# Patient Record
Sex: Male | Born: 1980 | Race: White | Hispanic: No | Marital: Single | Smoking: Current every day smoker
Health system: Southern US, Community
[De-identification: ages and names within clinical notes are randomized; demographics above are authoritative.]

## PROBLEM LIST (undated history)

## (undated) DIAGNOSIS — F329 Major depressive disorder, single episode, unspecified: Secondary | ICD-10-CM

## (undated) DIAGNOSIS — R569 Unspecified convulsions: Secondary | ICD-10-CM

## (undated) DIAGNOSIS — F419 Anxiety disorder, unspecified: Secondary | ICD-10-CM

## (undated) DIAGNOSIS — F32A Depression, unspecified: Secondary | ICD-10-CM

## (undated) HISTORY — PX: WISDOM TOOTH EXTRACTION: SHX21

---

## 2003-07-17 ENCOUNTER — Emergency Department (HOSPITAL_COMMUNITY): Admission: EM | Admit: 2003-07-17 | Discharge: 2003-07-17 | Payer: Self-pay | Admitting: *Deleted

## 2004-03-18 ENCOUNTER — Emergency Department (HOSPITAL_COMMUNITY): Admission: EM | Admit: 2004-03-18 | Discharge: 2004-03-19 | Payer: Self-pay | Admitting: *Deleted

## 2004-04-15 ENCOUNTER — Ambulatory Visit (HOSPITAL_COMMUNITY): Admission: RE | Admit: 2004-04-15 | Discharge: 2004-04-15 | Payer: Self-pay | Admitting: Family Medicine

## 2004-10-28 ENCOUNTER — Emergency Department (HOSPITAL_COMMUNITY): Admission: EM | Admit: 2004-10-28 | Discharge: 2004-10-28 | Payer: Self-pay | Admitting: Emergency Medicine

## 2005-02-18 ENCOUNTER — Emergency Department (HOSPITAL_COMMUNITY): Admission: EM | Admit: 2005-02-18 | Discharge: 2005-02-19 | Payer: Self-pay | Admitting: Emergency Medicine

## 2005-04-22 ENCOUNTER — Observation Stay (HOSPITAL_COMMUNITY): Admission: EM | Admit: 2005-04-22 | Discharge: 2005-04-23 | Payer: Self-pay | Admitting: Emergency Medicine

## 2006-02-26 ENCOUNTER — Emergency Department (HOSPITAL_COMMUNITY): Admission: EM | Admit: 2006-02-26 | Discharge: 2006-02-26 | Payer: Self-pay | Admitting: Emergency Medicine

## 2007-05-04 ENCOUNTER — Ambulatory Visit (HOSPITAL_COMMUNITY): Admission: RE | Admit: 2007-05-04 | Discharge: 2007-05-04 | Payer: Self-pay | Admitting: Family Medicine

## 2011-04-25 NOTE — Procedures (Signed)
REQUESTING PHYSICIAN:  Scott A. Gerda Diss, M.D.   The patient described as awake and drowsy. This is a routine EEG done with  photic and hyperventilation.   CLINICAL HISTORY:  A 30 year old man with history of dizzy spells and  falling. EEG is for evaluation.   DESCRIPTION:  The dominant rhythm of this tracing is a moderate amplitude  alpha rhythm, 10 to 11 hertz, which dominates posteriorly, appears not in  normal asymmetry. It attenuates with eye opening and closing.  __________  activity is seen frontally and centrally and appears not in normal  asymmetry. No focal slowing and no epileptiform discharges are seen. Much of  the recording is made in the drowsy state as evidenced by fragmentation of  the background and generalized attenuation of rhythms into a low amplitude  theta state. Intermittent spells of high amplitude theta rhythms with 5 to 6  hertz are seen and dominating frontally and centrally, and these are felt to  represent a post arousal pattern and are not definitely epileptiform. Stage  II sleep is not seen. Photic stimulation produced symmetric driving  responses. Hyperventilation produced no significant change in the background  rhythm. Single channel devoted to EKG revealed sinus rhythm throughout a  rate of approximately 78 beats per minute.   CONCLUSION:  Normal study in the awake and drowsy states.    Fenton Malling, M.D.   ZOX:WRUE  D:  04/15/2004 21:02:13  T:  04/16/2004 07:42:16  Job #:  [id number]

## 2011-04-25 NOTE — Consult Note (Signed)
NAME:  Shane Butler, Shane Butler                ACCOUNT NO.:  [id number]   MEDICAL RECORD NO.:  [id number]          PATIENT TYPE:  OBV   LOCATION:  A220                          FACILITY:  [facility]   PHYSICIAN:  Scott A. Gerda Diss, MD    DATE OF BIRTH:  23-Jun-1981   DATE OF CONSULTATION:  DATE OF DISCHARGE:  04/23/2005                                   CONSULTATION   Patient discharged from observation.  No further seizures.  It is thought  this seizure is probably alcohol-related.  He needs outpatient EEG.  His MRI  was completely normal.  He is to follow up with Dr. Gerilyn Pilgrim for EEG as well  as an appointment.  He will also follow up with [place] in approximately 3-4  weeks.   In addition, he was sent home on Ativan 0.5 mg 1 t.i.d. x2 days, then 1  b.i.d. x2 days, then 1 daily p.r.n.  He is to avoid alcohol.  He is also to  notify us if any problems.  He is not to drive until cleared to do so;  however, the patient currently does not have a license anyway.      SAL/MEDQ  D:  04/23/2005  T:  04/23/2005  Job:  [id number]

## 2011-04-25 NOTE — H&P (Signed)
NAME:  Shane Butler, Shane Butler                ACCOUNT NO.:  [id number]   MEDICAL RECORD NO.:  [id number]          PATIENT TYPE:  OBV   LOCATION:  A220                          FACILITY:  [facility]   PHYSICIAN:  Scott A. Gerda Diss, MD    DATE OF BIRTH:  June 18, 1981   DATE OF ADMISSION:  04/22/2005  DATE OF DISCHARGE:  LH                                HISTORY & PHYSICAL   CHIEF COMPLAINT:  Seizure.   HISTORY OF PRESENT ILLNESS:  This is a 30 year old male who had a seizure  earlier today, and his mom states that she heard a loud noise in his  bedroom, went to check it out, and found him having a seizure on the floor.  It lasted approximately 10 minutes.  She called EMS.  EMS came and then  brought him to the hospital.  His postictal state lasted approximately 20  minutes.  There was no vomiting, fever, diarrhea.  No recent head injuries,  cough, congestion, or sickness.  The patient had one episode approximately  13 months ago when he was at work, felt dizzy, and stated he passed out, hit  his head, and had a seizure.  He had not had one since then.  At that time,  he had an EEG, which was normal, and a CT scan which was normal.   PAST MEDICAL HISTORY:  1.  The patient has anxiety problems.  He has been on Zoloft but does not      take Xanax on a regular basis.  States his overall energy level has been      pretty decent today.  It has also been noted socially that the patient      does drink.  He notes five beers per night and states he did not drink      any beer last night.  Denies abuse of any drugs.  He had a drug screen      done in the ER which did show benzodiazepines.  He does use those on      rare occasions according to the patient.   REVIEW OF SYSTEMS:  See present illness.   PHYSICAL EXAMINATION:  GENERAL:  Benign.  HEENT:  Pupils respond to light.  TMs __________.  NECK:  Supple.  CHEST: Clear.  HEART:  Regular, no murmurs.  ABDOMEN:  Soft.  EXTREMITIES:  No edema.  SKIN:  Warm and  dry.  NEUROLOGIC:  Testing normal.   LABORATORY DATA:  Overall labs look good.  Hemoglobin slightly elevated at  17.2.  BUN and creatinine look normal.  Urinalysis negative.   ASSESSMENT AND PLAN:  Seizure, negative CT scan.  This is concerning given  that this is a second episode.  I think it could well have been a withdrawal  seizure given that he did not drink. We will go ahead and give him low dose  of Xanax every 6 hours while here in the hospital.  Could also be onset of  an adult seizure disorder.  I think it is  best to go ahead and have Dr. Gerilyn Pilgrim see the  patient for evaluation as  well in that he may need to be on antiseizure medication.  Certainly I think  he will probably need to have an EEG done again.  Hopefully the patient will  be able to go home tomorrow.      SAL/MEDQ  D:  04/22/2005  T:  04/22/2005  Job:  [id number]

## 2011-04-25 NOTE — Consult Note (Signed)
NAME:  Shane Butler, Shane Butler                ACCOUNT NO.:  [id number]   MEDICAL RECORD NO.:  [id number]          PATIENT TYPE:  OBV   LOCATION:  A220                          FACILITY:  [facility]   PHYSICIAN:  Kofi A. Gerilyn Pilgrim, M.D. DATE OF BIRTH:  05-Dec-1981   DATE OF CONSULTATION:  04/22/2005  DATE OF DISCHARGE:                                   CONSULTATION   DIAGNOSES:  The patient presents with alcohol withdrawal seizures.  There  does not seem to be any significant risk factors baseline other than perhaps  a mild head injury.   PLAN:  1.  Repeat EEG.  2.  MRI of the brain.  3.  Seizure precautions, particularly avoiding driving and operating      machinery.  The patient apparently does not drive because of a DUI      conviction two years ago.  Seizure precautions were also discussed with      the patient such as not avoiding heights for now.   HISTORY:  This 30 year old Caucasian man, who has history of anxiety,  currently at baseline.  The patient apparently had an event a year ago where  he apparently got dizzy and passed out.  He was witnessed to have what  appeared to be seizure activity briefly after he passed out.  He had a  workup at that time including EEG which was unrevealing.  The patient takes  Zoloft at baseline and has been on alprazolam, although he has not been  prescribed this recently.  He apparently takes alprazolam every now and  then, about once a week or so, but not on a consistent basis.  The patient  indicates that he does drink and has had a problem with drinking in the  past.  He indicated that his drinking eventually got better but continues to  drink on average about four beers nightly.  He did not drink anything last  night.  The patient does not recall a family history of seizures.  He has  had a mild head injury associated with concussion and brief loss of  consciousness in the past.  There is no history of CNS infection such as  meningitis.  He reports  being born full term without any complications and  has done well at school without any developmental delays.   PAST MEDICAL HISTORY:  As above.   MEDICATIONS:  Zoloft.   SOCIAL HISTORY:  Archivist.  No reports of illicit drug use.  Does  drink about five beers nightly.   FAMILY HISTORY:  Not applicable.   REVIEW OF SYSTEMS:  As in history of present illness, especially he does  endorse having significant muscle soreness after his seizures.  He does  complain of some mild posterior headache.   PHYSICAL EXAMINATION:  GENERAL:  Pleasant, well-nourished gentleman in no  acute distress.  VITAL SIGNS:  Temperature 97.7, pulse 85, blood pressure 120/69,  respirations 28.  NECK:  Supple  NEUROLOGIC:  The patient is awake, alert.  He converses well.  Speech,  language, and cognition are intact.  Cranial nerve evaluation shows  large  pupils at 6 mm that are briskly reactive.  Funduscopic evaluation shows  flat disks although no spontaneous venous pulsations are noted.  Visual  fields are intact.  Extraocular movements are intact.  Facial muscles are  symmetric.  Tongue is midline.  Uvula is midline.  Shoulder shrugs are  normal.  Motor examination shows normal tone, bulk, and strength.  There is  no pronator drift.  Coordination is intact.  Reflexes are normal.  Plantar  reflexes are both flexor.  Sensory examination intact to light touch and  temperature.   LABORATORY DATA AND OTHER STUDIES:  Head CT scan films reviewed and shows no  acute process.   WBC 7, hemoglobin 17, platelet count 225.  Sodium 139, potassium 3.8,  chloride 102, CO2 26, glucose 118, BUN 13, creatinine 1.1.  Liver enzymes  are normal.  Calcium 9.6.  Urinalysis negative.  Urine drug screen positive  for benzodiazepines.   Thanks for this consultation.       KAD/MEDQ  D:  04/22/2005  T:  04/22/2005  Job:  [id number]

## 2014-04-17 ENCOUNTER — Encounter: Payer: Self-pay | Admitting: Family Medicine

## 2014-04-17 ENCOUNTER — Ambulatory Visit (INDEPENDENT_AMBULATORY_CARE_PROVIDER_SITE_OTHER): Payer: BC Managed Care – PPO | Admitting: Family Medicine

## 2014-04-17 VITALS — BP 128/80 | Temp 98.0°F | Ht 71.0 in | Wt 121.4 lb

## 2014-04-17 DIAGNOSIS — J069 Acute upper respiratory infection, unspecified: Secondary | ICD-10-CM

## 2014-04-17 DIAGNOSIS — J019 Acute sinusitis, unspecified: Secondary | ICD-10-CM

## 2014-04-17 MED ORDER — HYDROCODONE-HOMATROPINE 5-1.5 MG/5ML PO SYRP: 5.0000 mL | ORAL_SOLUTION | Freq: Three times a day (TID) | ORAL | Status: DC | PRN

## 2014-04-17 MED ORDER — AZITHROMYCIN 250 MG PO TABS: ORAL_TABLET | ORAL | Status: DC

## 2014-04-17 NOTE — Progress Notes (Signed)
   Subjective:    Patient ID: Shane Butler, male    DOB: 27-Jul-1981, 33 y.o.   MRN: [id number]  URI  This is a new problem. The current episode started in the past 7 days. The problem has been unchanged. There has been no fever. Associated symptoms include congestion, coughing and rhinorrhea. Pertinent negatives include no chest pain, ear pain or wheezing. Associated symptoms comments: Hoarse voice . He has tried decongestant for the symptoms. The treatment provided no relief.   Patient states he has no other concerns at this time.    Review of Systems  Constitutional: Negative for fever, chills and activity change.       Hoarse  HENT: Positive for congestion and rhinorrhea. Negative for ear pain.   Eyes: Negative for discharge.  Respiratory: Positive for cough. Negative for wheezing.   Cardiovascular: Negative for chest pain.       Objective:   Physical Exam  Nursing note and vitals reviewed. Constitutional: He appears well-developed.  HENT:  Head: Normocephalic.  Mouth/Throat: Oropharynx is clear and moist. No oropharyngeal exudate.  Neck: Normal range of motion.  Cardiovascular: Normal rate, regular rhythm and normal heart sounds.   No murmur heard. Pulmonary/Chest: Effort normal and breath sounds normal. He has no wheezes.  Lymphadenopathy:    He has no cervical adenopathy.  Neurological: He exhibits normal muscle tone.  Skin: Skin is warm and dry.          Assessment & Plan:  1. Acute upper respiratory infections of unspecified site Antibiotics prescribed for the sinuses Z-Pak as directed followup if ongoing troubles  2. Acute sinus infection More than likely start off as a virus triggered sinus infection antibiotics prescribed cough medication given not for long-term use cautioned drowsiness warning signs discussed

## 2015-04-11 ENCOUNTER — Encounter: Payer: Self-pay | Admitting: Family Medicine

## 2015-04-11 ENCOUNTER — Ambulatory Visit (INDEPENDENT_AMBULATORY_CARE_PROVIDER_SITE_OTHER): Payer: 59 | Admitting: Family Medicine

## 2015-04-11 VITALS — BP 114/76 | Temp 98.3°F | Ht 69.75 in | Wt 125.0 lb

## 2015-04-11 DIAGNOSIS — J329 Chronic sinusitis, unspecified: Secondary | ICD-10-CM | POA: Diagnosis not present

## 2015-04-11 DIAGNOSIS — J310 Chronic rhinitis: Secondary | ICD-10-CM

## 2015-04-11 MED ORDER — AZITHROMYCIN 250 MG PO TABS: ORAL_TABLET | ORAL | Status: DC

## 2015-04-11 MED ORDER — HYDROCODONE-HOMATROPINE 5-1.5 MG/5ML PO SYRP: ORAL_SOLUTION | ORAL | Status: DC

## 2015-04-11 NOTE — Progress Notes (Signed)
   Subjective:    Patient ID: Shane Butler, male    DOB: [birth year], 34 y.o.   MRN: [id number]  Cough This is a recurrent problem. Associated symptoms include headaches, nasal congestion, a sore throat and wheezing. Treatments tried: nyquil, dayquil, cough syrups, benadryl, zyrtec.    Cough up cong and drainage  Has used alb in the past  Allergy stuff normal runny nose etc. Last wk  Headache, primarily frontal in nature.  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   Respiratory: Positive for cough and wheezing.   Neurological: Positive for headaches.       Objective:   Physical Exam Alert mild malaise. H&T moderate his congestion frontal tenderness pharynx slight erythema neck supple. Lungs clear. Heart rare rhythm.       Assessment & Plan:  Impression acute rhinosinusitis #2 allergic rhinitis plan antibiotics prescribed. Symptom care discussed. Allergy measures discussed WSL

## 2015-08-15 ENCOUNTER — Encounter: Payer: Self-pay | Admitting: Family Medicine

## 2015-08-15 ENCOUNTER — Ambulatory Visit (INDEPENDENT_AMBULATORY_CARE_PROVIDER_SITE_OTHER): Payer: 59 | Admitting: Family Medicine

## 2015-08-15 VITALS — BP 120/76 | Temp 97.9°F | Ht 69.75 in | Wt 119.6 lb

## 2015-08-15 DIAGNOSIS — J019 Acute sinusitis, unspecified: Secondary | ICD-10-CM

## 2015-08-15 DIAGNOSIS — B9689 Other specified bacterial agents as the cause of diseases classified elsewhere: Secondary | ICD-10-CM

## 2015-08-15 MED ORDER — AMOXICILLIN-POT CLAVULANATE 875-125 MG PO TABS: 1.0000 | ORAL_TABLET | Freq: Two times a day (BID) | ORAL | Status: DC

## 2015-08-15 NOTE — Patient Instructions (Signed)
Call us if not getting better with Augmentin   May use Zrytec and Flonase as needed

## 2015-08-15 NOTE — Progress Notes (Signed)
   Subjective:    Patient ID: Shane Butler, male    DOB: [birth year], 34 y.o.   MRN: [id number]  Sinus Problem This is a new problem. The current episode started 1 to 4 weeks ago. Associated symptoms include congestion, coughing, ear pain, headaches and a sore throat. (Fever) Treatments tried: otc meds.   Patient relates about a week and half of head congestion sinus pressure pain discomfort he has a history of a deviated septum has difficult time breathing through his nose because of it has significant sinus pressure pain and discomfort along with mucoid drainage over the past week no high fever chills   Review of Systems  Constitutional: Negative for fever and activity change.  HENT: Positive for congestion, ear pain, rhinorrhea and sore throat.   Eyes: Negative for discharge.  Respiratory: Positive for cough. Negative for wheezing.   Cardiovascular: Negative for chest pain.  Neurological: Positive for headaches.       Objective:   Physical Exam  Constitutional: He appears well-developed.  HENT:  Head: Normocephalic.  Mouth/Throat: Oropharynx is clear and moist. No oropharyngeal exudate.  Neck: Normal range of motion.  Cardiovascular: Normal rate, regular rhythm and normal heart sounds.   No murmur heard. Pulmonary/Chest: Effort normal and breath sounds normal. He has no wheezes.  Lymphadenopathy:    He has no cervical adenopathy.  Neurological: He exhibits normal muscle tone.  Skin: Skin is warm and dry.  Nursing note and vitals reviewed.         Assessment & Plan:  Sinusitis-antibiotics prescribed warning signs discussed follow-up of problems  Mild allergies Claritin and Flonase recommended  Excessive stress associated with his job we discussed how he was doing he denies being depressed

## 2016-05-06 ENCOUNTER — Ambulatory Visit (INDEPENDENT_AMBULATORY_CARE_PROVIDER_SITE_OTHER): Payer: No Typology Code available for payment source | Admitting: Family Medicine

## 2016-05-06 ENCOUNTER — Encounter: Payer: Self-pay | Admitting: Family Medicine

## 2016-05-06 VITALS — BP 106/70 | Temp 98.6°F | Ht 69.75 in | Wt 123.2 lb

## 2016-05-06 DIAGNOSIS — R590 Localized enlarged lymph nodes: Secondary | ICD-10-CM | POA: Diagnosis not present

## 2016-05-06 DIAGNOSIS — R220 Localized swelling, mass and lump, head: Secondary | ICD-10-CM

## 2016-05-06 DIAGNOSIS — R221 Localized swelling, mass and lump, neck: Secondary | ICD-10-CM

## 2016-05-06 MED ORDER — DOXYCYCLINE HYCLATE 100 MG PO CAPS: 100.0000 mg | ORAL_CAPSULE | Freq: Two times a day (BID) | ORAL | Status: DC

## 2016-05-06 NOTE — Progress Notes (Signed)
   Subjective:    Patient ID: Shane Butler, male    DOB: March 18, 1981, 35 y.o.   MRN: [id number]  HPI Patient is here today for a lump in his throat. Onset 2 days ago. Treatments tried: none.  Patient does have a history of smoking does have a family history of cancer He states he notices couple days ago denies any fever chills or severe pain with it. No vomiting or diarrhea sweats weight loss or appetite change.  Review of Systems     Objective:   Physical Exam  HEENT eardrums normal throat is normal no oral masses are felt but there is a submandibular growth on the left anterior aspect could be a lymph node versus a growth lungs are clear hearts regular      Assessment & Plan:  Growth in the left anterior neck region is noted. There does not appear to be any abscess with this. It is not tender. Patient not having any constitutional symptoms of lymphoma. I do recommend lab work. We will do one round of antibiotics. Referral to ENT. May need CAT scan as well.

## 2016-05-07 ENCOUNTER — Telehealth: Payer: Self-pay | Admitting: *Deleted

## 2016-05-07 ENCOUNTER — Other Ambulatory Visit: Payer: Self-pay | Admitting: *Deleted

## 2016-05-07 DIAGNOSIS — R590 Localized enlarged lymph nodes: Secondary | ICD-10-CM

## 2016-05-07 NOTE — Progress Notes (Signed)
Dr Nicki Reaper spoke with dr Benjamine Mola. See telephone note

## 2016-05-07 NOTE — Telephone Encounter (Signed)
Dr. Nicki Reaper spoke with DR. Teoh. Pt needs ct soft tissue neck with contrast before appt with dr Benjamine Mola. CT scheduled [facility] June 8th at 1 pm. Register 12:45. Pt notified of appt.

## 2016-05-07 NOTE — Telephone Encounter (Signed)
I did discuss the case with Dr.Teoh, he does recommend having the CAT scan done then having the patient being seen by him please set up office visit with ENT to be after the CAT scan possibly the following week

## 2016-05-08 LAB — CBC WITH DIFFERENTIAL/PLATELET
BASOS ABS: 0.1 10*3/uL (ref 0.0–0.2)
Basos: 1 %
EOS (ABSOLUTE): 0.4 10*3/uL (ref 0.0–0.4)
Eos: 6 %
HEMOGLOBIN: 15.9 g/dL (ref 12.6–17.7)
Hematocrit: 47.3 % (ref 37.5–51.0)
IMMATURE GRANS (ABS): 0 10*3/uL (ref 0.0–0.1)
Immature Granulocytes: 0 %
LYMPHS ABS: 2.1 10*3/uL (ref 0.7–3.1)
Lymphs: 33 %
MCH: 31 pg (ref 26.6–33.0)
MCHC: 33.6 g/dL (ref 31.5–35.7)
MCV: 92 fL (ref 79–97)
MONOCYTES: 7 %
Monocytes Absolute: 0.5 10*3/uL (ref 0.1–0.9)
Neutrophils Absolute: 3.4 10*3/uL (ref 1.4–7.0)
Neutrophils: 53 %
Platelets: 230 10*3/uL (ref 150–379)
RBC: 5.13 x10E6/uL (ref 4.14–5.80)
RDW: 13.8 % (ref 12.3–15.4)
WBC: 6.5 10*3/uL (ref 3.4–10.8)

## 2016-05-08 LAB — HEPATIC FUNCTION PANEL
ALBUMIN: 4.3 g/dL (ref 3.5–5.5)
ALK PHOS: 106 IU/L (ref 39–117)
ALT: 21 IU/L (ref 0–44)
AST: 19 IU/L (ref 0–40)
Bilirubin Total: 0.7 mg/dL (ref 0.0–1.2)
Bilirubin, Direct: 0.16 mg/dL (ref 0.00–0.40)
TOTAL PROTEIN: 7 g/dL (ref 6.0–8.5)

## 2016-05-08 LAB — BASIC METABOLIC PANEL
BUN/Creatinine Ratio: 14 (ref 9–20)
BUN: 12 mg/dL (ref 6–20)
CO2: 24 mmol/L (ref 18–29)
CREATININE: 0.87 mg/dL (ref 0.76–1.27)
Calcium: 9.4 mg/dL (ref 8.7–10.2)
Chloride: 99 mmol/L (ref 96–106)
GFR calc Af Amer: 131 mL/min/{1.73_m2} (ref >59)
GFR calc non Af Amer: 113 mL/min/{1.73_m2} (ref >59)
GLUCOSE: 68 mg/dL (ref 65–99)
Potassium: 4.7 mmol/L (ref 3.5–5.2)
SODIUM: 144 mmol/L (ref 134–144)

## 2016-05-08 LAB — TSH: TSH: 0.602 u[IU]/mL (ref 0.450–4.500)

## 2016-05-09 NOTE — Addendum Note (Signed)
Addended by: Launa Grill on: 05/09/2016 03:59 PM   Modules accepted: Orders

## 2016-05-15 ENCOUNTER — Ambulatory Visit (HOSPITAL_COMMUNITY)
Admission: RE | Admit: 2016-05-15 | Discharge: 2016-05-15 | Disposition: A | Discharge status: Home / Self Care | Payer: No Typology Code available for payment source | Source: Ambulatory Visit | Attending: Family Medicine | Admitting: Family Medicine

## 2016-05-15 DIAGNOSIS — R590 Localized enlarged lymph nodes: Secondary | ICD-10-CM | POA: Insufficient documentation

## 2016-05-15 MED ORDER — IOPAMIDOL (ISOVUE-300) INJECTION 61%
75.0000 mL | Freq: Once | INTRAVENOUS | Status: AC | PRN
  Administered 2016-05-15: 75 mL via INTRAVENOUS

## 2016-05-15 NOTE — Progress Notes (Signed)
Appt schedule with Dr. Benjamine Mola for Monday 05/19/2016 at 1:50 pm.  I called and notified the patient, he states that he has to work Monday.  I gave them the phone number to reschedule and advised him if he had trouble rescheduling to please give [place] a call.

## 2016-05-19 ENCOUNTER — Ambulatory Visit (INDEPENDENT_AMBULATORY_CARE_PROVIDER_SITE_OTHER): Payer: No Typology Code available for payment source | Admitting: Otolaryngology

## 2016-05-19 DIAGNOSIS — D487 Neoplasm of uncertain behavior of other specified sites: Secondary | ICD-10-CM

## 2016-05-19 DIAGNOSIS — R220 Localized swelling, mass and lump, head: Secondary | ICD-10-CM

## 2016-06-12 ENCOUNTER — Ambulatory Visit (INDEPENDENT_AMBULATORY_CARE_PROVIDER_SITE_OTHER): Payer: No Typology Code available for payment source | Admitting: Otolaryngology

## 2016-06-12 DIAGNOSIS — D487 Neoplasm of uncertain behavior of other specified sites: Secondary | ICD-10-CM

## 2016-08-14 ENCOUNTER — Ambulatory Visit (INDEPENDENT_AMBULATORY_CARE_PROVIDER_SITE_OTHER): Payer: No Typology Code available for payment source | Admitting: Otolaryngology

## 2016-08-14 DIAGNOSIS — D487 Neoplasm of uncertain behavior of other specified sites: Secondary | ICD-10-CM | POA: Diagnosis not present

## 2017-02-16 ENCOUNTER — Encounter: Payer: Self-pay | Admitting: Nurse Practitioner

## 2017-02-16 ENCOUNTER — Ambulatory Visit (INDEPENDENT_AMBULATORY_CARE_PROVIDER_SITE_OTHER): Payer: No Typology Code available for payment source | Admitting: Nurse Practitioner

## 2017-02-16 VITALS — BP 114/80 | Temp 98.1°F | Ht 69.75 in | Wt 126.2 lb

## 2017-02-16 DIAGNOSIS — J069 Acute upper respiratory infection, unspecified: Secondary | ICD-10-CM

## 2017-02-16 DIAGNOSIS — K219 Gastro-esophageal reflux disease without esophagitis: Secondary | ICD-10-CM | POA: Diagnosis not present

## 2017-02-16 DIAGNOSIS — J209 Acute bronchitis, unspecified: Secondary | ICD-10-CM | POA: Diagnosis not present

## 2017-02-16 MED ORDER — OMEPRAZOLE 20 MG PO CPDR: 20.0000 mg | DELAYED_RELEASE_CAPSULE | Freq: Every day | ORAL | 2 refills | Status: DC

## 2017-02-16 MED ORDER — AZITHROMYCIN 250 MG PO TABS: ORAL_TABLET | ORAL | 0 refills | Status: DC

## 2017-02-16 MED ORDER — HYDROCODONE-HOMATROPINE 5-1.5 MG/5ML PO SYRP: 5.0000 mL | ORAL_SOLUTION | ORAL | 0 refills | Status: DC | PRN

## 2017-02-16 NOTE — Patient Instructions (Signed)
Food Choices for Gastroesophageal Reflux Disease, Adult When you have gastroesophageal reflux disease (GERD), the foods you eat and your eating habits are very important. Choosing the right foods can help ease the discomfort of GERD. Consider working with a diet and nutrition specialist (dietitian) to help you make healthy food choices. What general guidelines should I follow? Eating plan  Choose healthy foods low in fat, such as fruits, vegetables, whole grains, low-fat dairy products, and lean meat, fish, and poultry.  Eat frequent, small meals instead of three large meals each day. Eat your meals slowly, in a relaxed setting. Avoid bending over or lying down until 2-3 hours after eating.  Limit high-fat foods such as fatty meats or fried foods.  Limit your intake of oils, butter, and shortening to less than 8 teaspoons each day.  Avoid the following: ? Foods that cause symptoms. These may be different for different people. Keep a food diary to keep track of foods that cause symptoms. ? Alcohol. ? Drinking large amounts of liquid with meals. ? Eating meals during the 2-3 hours before bed.  Cook foods using methods other than frying. This may include baking, grilling, or broiling. Lifestyle   Maintain a healthy weight. Ask your health care provider what weight is healthy for you. If you need to lose weight, work with your health care provider to do so safely.  Exercise for at least 30 minutes on 5 or more days each week, or as told by your health care provider.  Avoid wearing clothes that fit tightly around your waist and chest.  Do not use any products that contain nicotine or tobacco, such as cigarettes and e-cigarettes. If you need help quitting, ask your health care provider.  Sleep with the head of your bed raised. Use a wedge under the mattress or blocks under the bed frame to raise the head of the bed. What foods are not recommended? The items listed may not be a complete  list. Talk with your dietitian about what dietary choices are best for you. Grains Pastries or quick breads with added fat. French toast. Vegetables Deep fried vegetables. French fries. Any vegetables prepared with added fat. Any vegetables that cause symptoms. For some people this may include tomatoes and tomato products, chili peppers, onions and garlic, and horseradish. Fruits Any fruits prepared with added fat. Any fruits that cause symptoms. For some people this may include citrus fruits, such as oranges, grapefruit, pineapple, and lemons. Meats and other protein foods High-fat meats, such as fatty beef or pork, hot dogs, ribs, ham, sausage, salami and bacon. Fried meat or protein, including fried fish and fried chicken. Nuts and nut butters. Dairy Whole milk and chocolate milk. Sour cream. Cream. Ice cream. Cream cheese. Milk shakes. Beverages Coffee and tea, with or without caffeine. Carbonated beverages. Sodas. Energy drinks. Fruit juice made with acidic fruits (such as orange or grapefruit). Tomato juice. Alcoholic drinks. Fats and oils Butter. Margarine. Shortening. Ghee. Sweets and desserts Chocolate and cocoa. Donuts. Seasoning and other foods Pepper. Peppermint and spearmint. Any condiments, herbs, or seasonings that cause symptoms. For some people, this may include curry, hot sauce, or vinegar-based salad dressings. Summary  When you have gastroesophageal reflux disease (GERD), food and lifestyle choices are very important to help ease the discomfort of GERD.  Eat frequent, small meals instead of three large meals each day. Eat your meals slowly, in a relaxed setting. Avoid bending over or lying down until 2-3 hours after eating.  Limit high-fat   foods such as fatty meat or fried foods. This information is not intended to replace advice given to you by your health care provider. Make sure you discuss any questions you have with your health care provider. Document Released:  11/24/2005 Document Revised: 11/25/2016 Document Reviewed: 11/25/2016 Elsevier Interactive Patient Education  2017 [employer] Inc[employer]  

## 2017-02-17 ENCOUNTER — Encounter: Payer: Self-pay | Admitting: Nurse Practitioner

## 2017-02-17 DIAGNOSIS — K219 Gastro-esophageal reflux disease without esophagitis: Secondary | ICD-10-CM | POA: Insufficient documentation

## 2017-02-17 NOTE — Progress Notes (Signed)
Subjective:  Presents for c/o persistent cough x 2 d. Fever has improved. Scratchy throat. Headache. Runny nose. Frequent cough day and night. Possible wheeze at times, described as a whistle. Pain in right lower anterior chest wall with movement or cough. No V/D. Upper mid abd pain. History of GERD. Not on medication at this time. Smokes about 1/4 ppd. Denies caffeine use. Eats spicy foods and tomato products. Drinks about 4 beers per night.   Objective:   BP 114/80   Temp 98.1 F (36.7 C) (Oral)   Ht 5' 9.75" (1.772 m)   Wt 126 lb 4 oz (57.3 kg)   BMI 18.25 kg/m  NAD. Alert, oriented. TMs retracted, no erythema. Pharynx injected with PND noted. Neck supple with mild anterior adenopathy. Lungs: scattered expiratory crackles posterior. No wheezing or tachypnea. Good air flow. Normal color. No tenderness to palpation of the right anterior chest wall. Abdomen soft with distinct epigastric area tenderness. No rebound or guarding. No obvious masses.   Assessment:  Problem List Items Addressed This Visit      Digestive   Gastroesophageal reflux disease without esophagitis   Relevant Medications   omeprazole (PRILOSEC) 20 MG capsule    Other Visit Diagnoses    Upper respiratory infection with cough and congestion    -  Primary   Relevant Medications   azithromycin (ZITHROMAX Z-PAK) 250 MG tablet   Acute bronchitis, unspecified organism           Plan:   Meds ordered this encounter  Medications  . azithromycin (ZITHROMAX Z-PAK) 250 MG tablet    Sig: Take 2 tablets (500 mg) on  Day 1,  followed by 1 tablet (250 mg) once daily on Days 2 through 5.    Dispense:  6 each    Refill:  0    Order Specific Question:   Supervising Provider    Answer:   Mikey Kirschner [[id number]]  . HYDROcodone-homatropine (HYCODAN) 5-1.5 MG/5ML syrup    Sig: Take 5 mLs by mouth every 4 (four) hours as needed. For night time use    Dispense:  90 mL    Refill:  0    Order Specific Question:   Supervising  Provider    Answer:   Mikey Kirschner [[id number]]  . omeprazole (PRILOSEC) 20 MG capsule    Sig: Take 1 capsule (20 mg total) by mouth daily.    Dispense:  30 capsule    Refill:  2    Order Specific Question:   Supervising Provider    Answer:   Mikey Kirschner [[id number]]   Use cough syrup at night. Given written information on diet for GERD. Encouraged smoking cessation and cutting back on alcohol intake. Take Omeprazole daily for 2-3 weeks. Call back at that time if pain persists.  Warning signs reviewed. Call back by end of the week if no improvement in respiratory symptoms, sooner if worse.

## 2017-03-06 IMAGING — CT CT NECK W/ CM
3 of 4 series · 11 of 33 positions shown, 13 images · IV contrast (iopamidol)
Comparison: Brain MRI and head CT 04/22/2005

CLINICAL DATA: 35-year-old male with left side neck palpable
abnormality discovered 1 week ago. Non painful. Started on
antibiotics. Initial encounter.

EXAM:
CT NECK WITH CONTRAST
TECHNIQUE: Multidetector CT imaging of the neck was performed using the
standard protocol following the bolus administration of intravenous
contrast.
CONTRAST:  75mL NAU8EU-THH IOPAMIDOL (NAU8EU-THH) INJECTION 61%

[Series 2: axial neck · axial · 0.35mm/px · z∈[+933,+1099]mm · 3 of 125 slices shown, 4 images]
[im 21/125  soft-tissue]
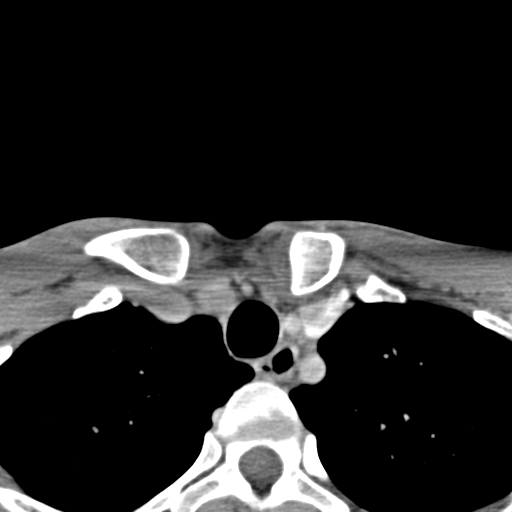
[im 21/125  bone]
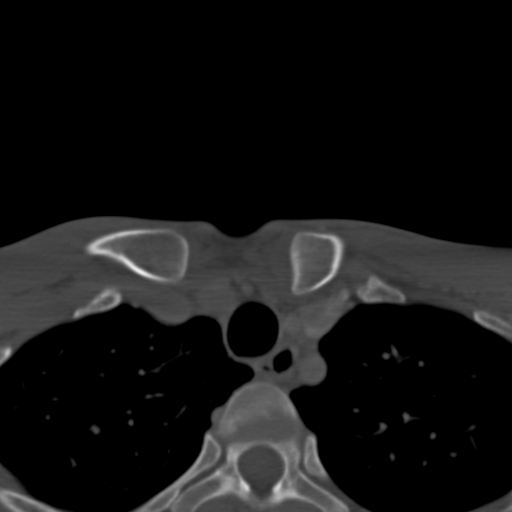
[im 63/125  bone]
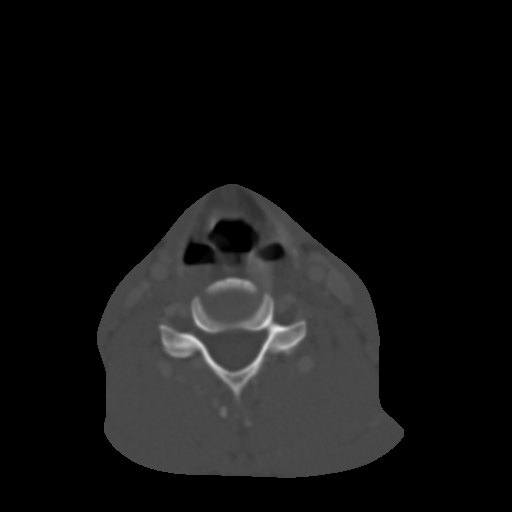
[im 104/125  bone]
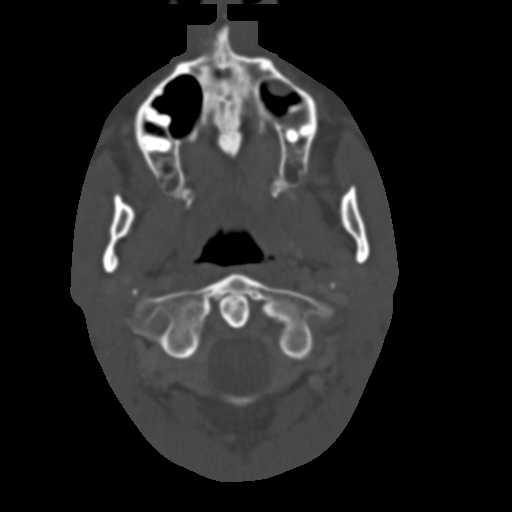

[Series 4: sag neck · sagittal · 0.34mm/px · 5 of 70 slices shown, 6 images]
[im 24/70  bone]
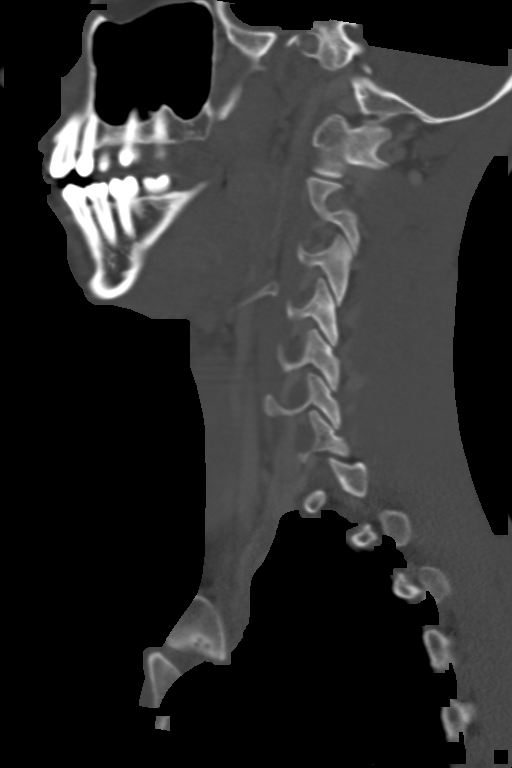
[im 29/70  bone]
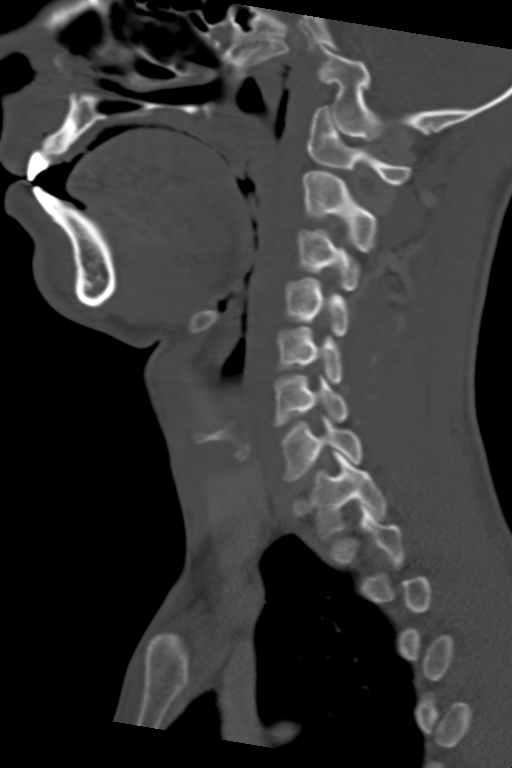
[im 35/70  soft-tissue]
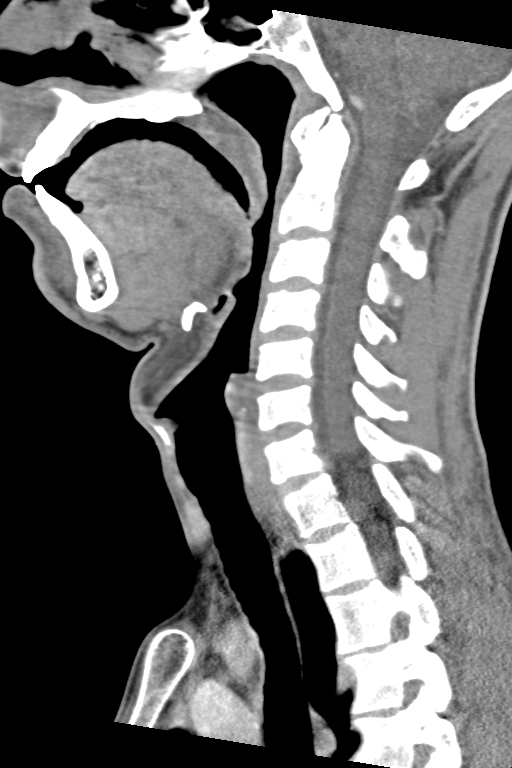
[im 35/70  bone]
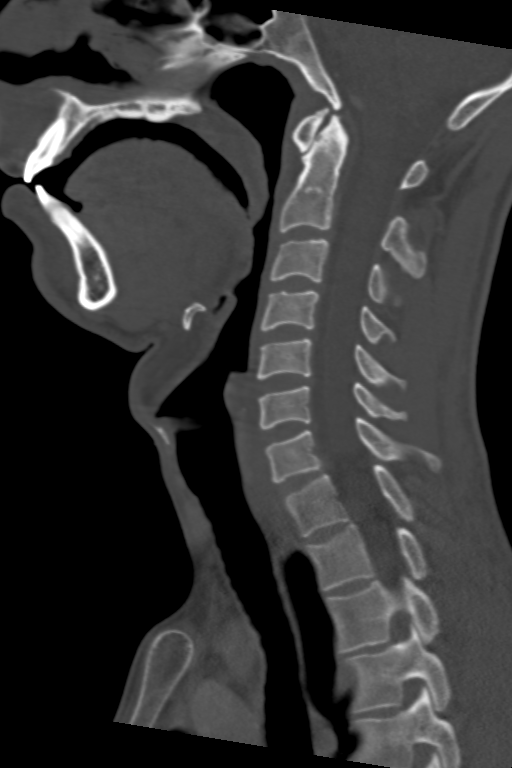
[im 41/70  bone]
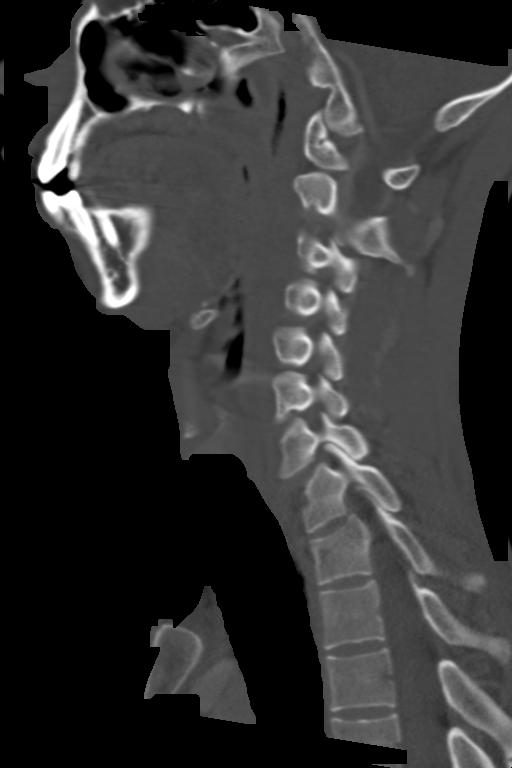
[im 47/70  bone]
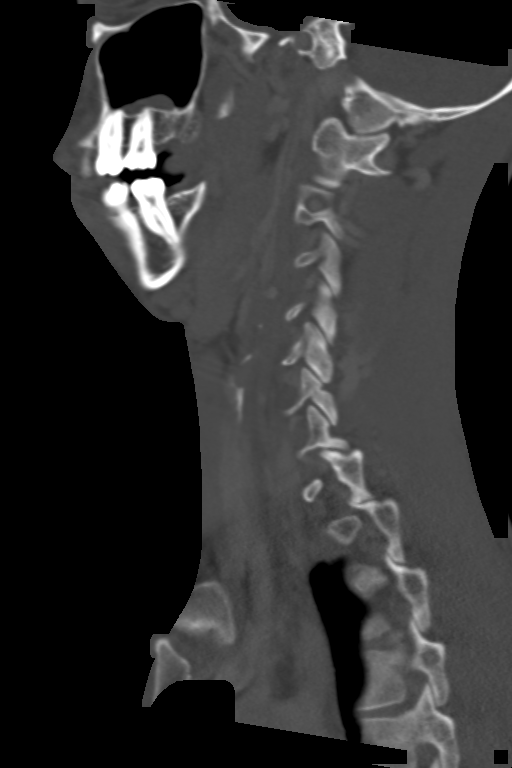

[Series 5: cor neck · coronal · 0.28mm/px · 3 of 80 slices shown]
[im 16/80  bone]
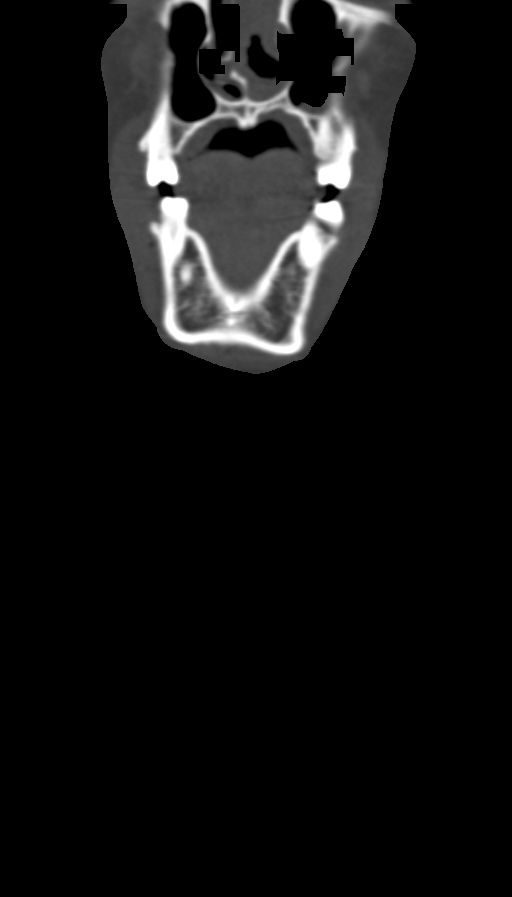
[im 32/80  bone]
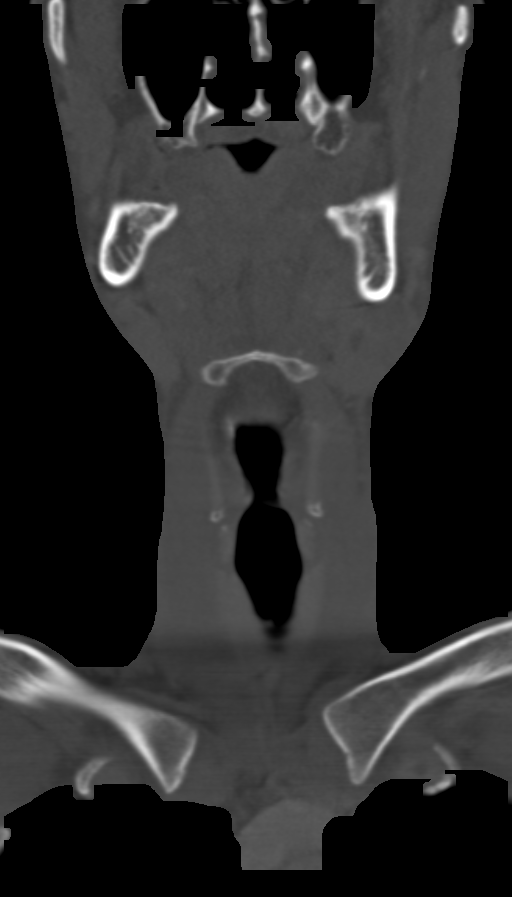
[im 48/80  bone]
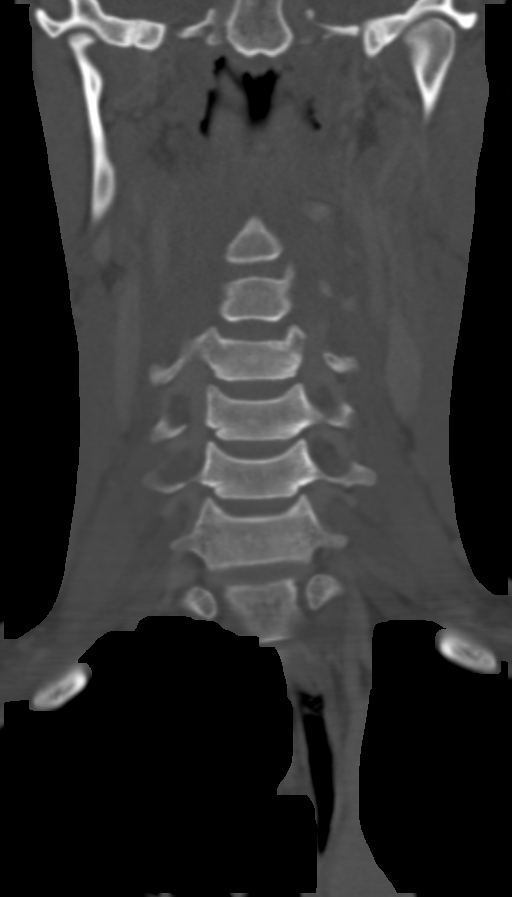

[11 of 33 positions shown; findings below may reference images not displayed]

FINDINGS: Pharynx and larynx: Larynx and pharynx soft tissues are within
normal limits. Negative parapharyngeal and retropharyngeal spaces.

Salivary glands: The right submandibular gland and parotid gland
appear normal. No left parotid gland abnormality identified.

The palpable area of concern is marked at the level of the left
submandibular gland on series 2, image 51. This area was mostly not
visible on the 2332 comparisons. There is asymmetric soft tissue at
the level of the marker are which appears related to either
asymmetric enlargement of the left submandibular gland (particularly
along the posterior margin as seen on series 2, image 47) versus
asymmetric enlarged left level 1B nodes adjacent to the gland. There
is no hyper enhancement of the gland to suggest acute sialoadenitis.
No sialolithiasis. The sublingual space appears to remain normal. A
diminutive level 1A node is normal on series 2, image 57. The
asymmetric increased soft tissue does not appear cystic or necrotic.
See also lymph nodes section findings below.

Thyroid: Negative.

Lymph nodes: In addition to the left level 1 B area a soft tissue
asymmetry there is asymmetrically increased and lobulated soft
tissue at the left level IIa station best seen on sagittal image 58
measuring up to 12 mm short axis. Outside of the left level 1 level
2 stations the bilateral cervical lymph nodes appear symmetric and
within normal limits. No cystic or necrotic nodes.

Vascular: Major vascular structures in the neck and at the skullbase
are normal.

Limited intracranial: Negative.

Visualized orbits: Negative.

Mastoids and visualized paranasal sinuses: Minimal alveolar recess
left maxillary sinus mucosal thickening, otherwise clear.

Skeleton: No left mandible or dental abnormality. No osseous
abnormality identified.

Upper chest: Normal visualized superior mediastinum. Mild paraseptal
emphysema in the lung apices, otherwise negative visualized lung
parenchyma.
IMPRESSION: 1. Abnormal increased soft tissue corresponding to the palpable area
of concern favored to represent abnormal left level 1b and level 2a
lymph nodes, but alternatively might reflect a mass of the left
submandibular gland plus regional lymph node involvement. Individual
nodes up to 12 mm short axis. No cystic or necrotic nodes. No
primary pharyngeal or laryngeal mass.
2. If the abnormality does not resolve with antibiotic treatment
recommend follow-up Face MRI without and with contrast to
re-evaluate and further characterize the left submandibular gland.
3. Otherwise normal neck CT.
4. These results will be called to the ordering clinician or
representative by the Radiologist Assistant, and communication
documented in the PACS or zVision Dashboard.

## 2017-03-16 ENCOUNTER — Emergency Department (HOSPITAL_COMMUNITY)
Admission: EM | Admit: 2017-03-16 | Discharge: 2017-03-16 | Disposition: A | Discharge status: Home / Self Care | Payer: No Typology Code available for payment source | Attending: Emergency Medicine | Admitting: Emergency Medicine

## 2017-03-16 ENCOUNTER — Encounter (HOSPITAL_COMMUNITY): Payer: Self-pay | Admitting: *Deleted

## 2017-03-16 DIAGNOSIS — Z79899 Other long term (current) drug therapy: Secondary | ICD-10-CM | POA: Diagnosis not present

## 2017-03-16 DIAGNOSIS — F17210 Nicotine dependence, cigarettes, uncomplicated: Secondary | ICD-10-CM | POA: Diagnosis not present

## 2017-03-16 DIAGNOSIS — F419 Anxiety disorder, unspecified: Secondary | ICD-10-CM | POA: Diagnosis present

## 2017-03-16 DIAGNOSIS — F1320 Sedative, hypnotic or anxiolytic dependence, uncomplicated: Secondary | ICD-10-CM | POA: Insufficient documentation

## 2017-03-16 DIAGNOSIS — F13930 Sedative, hypnotic or anxiolytic use, unspecified with withdrawal, uncomplicated: Secondary | ICD-10-CM

## 2017-03-16 DIAGNOSIS — F13230 Sedative, hypnotic or anxiolytic dependence with withdrawal, uncomplicated: Secondary | ICD-10-CM

## 2017-03-16 HISTORY — DX: Anxiety disorder, unspecified: F41.9

## 2017-03-16 HISTORY — DX: Depression, unspecified: F32.A

## 2017-03-16 HISTORY — DX: Major depressive disorder, single episode, unspecified: F32.9

## 2017-03-16 MED ORDER — ALPRAZOLAM 0.5 MG PO TABS
2.0000 mg | ORAL_TABLET | Freq: Once | ORAL | Status: AC
  Administered 2017-03-16: 2 mg via ORAL
  Filled 2017-03-16: qty 4

## 2017-03-16 MED ORDER — NAPROXEN 250 MG PO TABS
500.0000 mg | ORAL_TABLET | Freq: Once | ORAL | Status: AC
  Administered 2017-03-16: 500 mg via ORAL
  Filled 2017-03-16: qty 2

## 2017-03-16 NOTE — ED Provider Notes (Signed)
[facility] DEPT Provider Note   CSN: [id number] Arrival date & time: 03/16/17  1020  By signing my name below, I, Collene Leyden, attest that this documentation has been prepared under the direction and in the presence of Janetta Hora, PA-C. Marland Kitchen Electronically Signed: Collene Leyden, Scribe. 03/16/17. 11:24 AM.  History   Chief Complaint Chief Complaint  Patient presents with  . Anxiety    HPI Comments: Shane Butler is a 36 y.o. male with a history of anxiety and depression, who presents to the Emergency Department via EMS, complaining of sudden-onset "convulsion" that began earlier today. Patient states he has been on xanax since he was 41, ran out 3 days ago. Patient reports having "left over" pills from the past, in which he took 1/4th of a pill 2 days ago. Patent states he was driving when he suddenly became disoriented to time. Patient states he felt as if was driving 10 minutes, but in reality only drove a few seconds. Patient states he feels as if he is going through withdrawals. Patient reports associated mild headache (severity of 5/10) and dizziness. No modifying factors indicated. Patient is prescribed xanax by Dr. Wolfgang Phoenix, his primary care physician. Patient denies any nausea, vomiting, hallucinations, or diaphoresis.   The history is provided by the patient. No language interpreter was used.    Past Medical History:  Diagnosis Date  . Anxiety   . Depression     Patient Active Problem List   Diagnosis Date Noted  . Gastroesophageal reflux disease without esophagitis 02/17/2017    History reviewed. No pertinent surgical history.     Home Medications    Prior to Admission medications   Medication Sig Start Date End Date Taking? Authorizing Provider  alprazolam Duanne Moron) 2 MG tablet Take 2 mg by mouth at bedtime as needed for sleep.   Yes Historical Provider, MD  cetirizine (ZYRTEC) 10 MG tablet Take 10 mg by mouth daily.   Yes Historical Provider, MD    ChlordiazePOXIDE HCl (LIBRIUM PO) Take by mouth.   Yes Historical Provider, MD  sertraline (ZOLOFT) 100 MG tablet Take 100 mg by mouth daily.   Yes Historical Provider, MD  HYDROcodone-homatropine (HYCODAN) 5-1.5 MG/5ML syrup Take 5 mLs by mouth every 4 (four) hours as needed. For night time use Patient not taking: Reported on 03/16/2017 02/16/17   Nilda Simmer, NP  omeprazole (PRILOSEC) 20 MG capsule Take 1 capsule (20 mg total) by mouth daily. Patient not taking: Reported on 03/16/2017 02/16/17   Nilda Simmer, NP    Family History No family history on file.  Social History Social History  Substance Use Topics  . Smoking status: Current Every Day Smoker    Packs/day: 0.25    Types: Cigarettes  . Smokeless tobacco: Never Used  . Alcohol use 16.8 oz/week    28 Cans of beer per week     Comment: drinks 4 cans of beer each night     Allergies   Ciprofloxacin and Other   Review of Systems Review of Systems  Constitutional: Negative for diaphoresis and fever.  Gastrointestinal: Negative for nausea and vomiting.  Neurological: Positive for dizziness and headaches.  Psychiatric/Behavioral: Negative for hallucinations.     Physical Exam Updated Vital Signs BP (!) 137/92 (BP Location: Left Arm)   Pulse 85   Temp 98 F (36.7 C) (Oral)   Resp 18   Ht 5\' 10"  (1.778 m)   Wt 127 lb (57.6 kg)   SpO2 100%   BMI  18.22 kg/m   Physical Exam  Constitutional: He is oriented to person, place, and time. He appears well-developed and well-nourished.  HENT:  Head: Normocephalic and atraumatic.  Right Ear: External ear normal.  Left Ear: External ear normal.  Eyes: EOM are normal.  Neck: Normal range of motion.  Cardiovascular: Normal rate and regular rhythm.  Exam reveals no gallop and no friction rub.   No murmur heard. Pulmonary/Chest: Effort normal and breath sounds normal. He has no wheezes. He has no rales. He exhibits no tenderness.  Abdominal: He exhibits no  distension.  Musculoskeletal: Normal range of motion.  Neurological: He is alert and oriented to person, place, and time.  Psychiatric: He has a normal mood and affect.  Nursing note and vitals reviewed.    ED Treatments / Results  DIAGNOSTIC STUDIES: Oxygen Saturation is 100% on RA, normal by my interpretation.    COORDINATION OF CARE: 11:19 AM Discussed treatment plan with pt at bedside and pt agreed to plan, which includes ativan, aleve, and xanax.   Labs (all labs ordered are listed, but only abnormal results are displayed) Labs Reviewed - No data to display  EKG  EKG Interpretation None       Radiology No results found.  Procedures Procedures (including critical care time)  Medications Ordered in ED Medications  ALPRAZolam (XANAX) tablet 2 mg (2 mg Oral Given 03/16/17 1148)  naproxen (NAPROSYN) tablet 500 mg (500 mg Oral Given 03/16/17 1147)     Initial Impression / Assessment and Plan / ED Course  I have reviewed the triage vital signs and the nursing notes.  Pertinent labs & imaging results that were available during my care of the patient were reviewed by me and considered in my medical decision making (see chart for details).  36 year old male with mild benzo withdrawal due to being out of his Xanax for three days. He is mildly hypertensive otherwise vitals are normal. He is A&O x4. Will give one dose of Xanax and Aleve here in the ED and have him follow up with his doctor. He already has refills at his pharmacy and is not requesting any from me.  11:18 AM [place] narcotic database was reviewed and the patient is appropriately refilling his xanax Chucky May D  Return precautions given.  Final Clinical Impressions(s) / ED Diagnoses   Final diagnoses:  Benzodiazepine withdrawal without complication (HCC)    New Prescriptions New Prescriptions   No medications on file   I personally performed the services described in this documentation, which was scribed  in my presence. The recorded information has been reviewed and is accurate.    Recardo Evangelist, PA-C 03/16/17 1216    Elnora Morrison, MD 03/16/17 [phone]

## 2017-03-16 NOTE — Discharge Instructions (Signed)
Follow up with your doctor.  Return for worsening symptoms.  

## 2017-03-16 NOTE — ED Triage Notes (Signed)
Pt comes in by EMS for anxiety. Pt ran out of his xanax yesterday (pt had half a bar at 1100). Pt states he now feels like his time is distorted. Denis any SI/HI/AVH.

## 2017-09-14 ENCOUNTER — Encounter: Payer: Self-pay | Admitting: Family Medicine

## 2017-09-14 ENCOUNTER — Ambulatory Visit (INDEPENDENT_AMBULATORY_CARE_PROVIDER_SITE_OTHER): Payer: No Typology Code available for payment source | Admitting: Family Medicine

## 2017-09-14 VITALS — BP 118/74 | Temp 98.8°F | Ht 70.0 in | Wt 126.0 lb

## 2017-09-14 DIAGNOSIS — J019 Acute sinusitis, unspecified: Secondary | ICD-10-CM | POA: Diagnosis not present

## 2017-09-14 MED ORDER — HYDROCODONE-ACETAMINOPHEN 5-325 MG PO TABS: 1.0000 | ORAL_TABLET | ORAL | 0 refills | Status: DC | PRN

## 2017-09-14 MED ORDER — AMOXICILLIN 500 MG PO TABS: 500.0000 mg | ORAL_TABLET | Freq: Three times a day (TID) | ORAL | 0 refills | Status: DC

## 2017-09-14 NOTE — Progress Notes (Signed)
   Subjective:    Patient ID: Shane Butler, male    DOB: August 19, 1981, 36 y.o.   MRN: [id number]  Sinusitis  This is a new problem. Episode onset: 3-4 days. Associated symptoms include congestion and coughing. Pertinent negatives include no ear pain. (Cough, wheezing, runny nose, headache, sore throat, fever, abdominal pain, diarrhea, nausea, fatigue) Treatments tried: advil, nyquil, hydrocodone, aleve.   Broke his tooth last week. Has not been able to go to the dentist.   Knot on left side of neck. Swelling and painful.    Review of Systems  Constitutional: Negative for activity change and fever.  HENT: Positive for congestion and rhinorrhea. Negative for ear pain.   Eyes: Negative for discharge.  Respiratory: Positive for cough. Negative for wheezing.   Cardiovascular: Negative for chest pain.       Objective:   Physical Exam  Constitutional: He appears well-developed.  HENT:  Head: Normocephalic.  Mouth/Throat: Oropharynx is clear and moist. No oropharyngeal exudate.  Neck: Normal range of motion.  Cardiovascular: Normal rate, regular rhythm and normal heart sounds.   No murmur heard. Pulmonary/Chest: Effort normal and breath sounds normal. He has no wheezes.  Lymphadenopathy:    He has no cervical adenopathy.  Neurological: He exhibits normal muscle tone.  Skin: Skin is warm and dry.  Nursing note and vitals reviewed.   Patient had seen ENT for a nodule area on the left side that had follow-up scans which show that it resolved on physical exam today he has minimal lymphadenopathy on both sides no dominant mass no sign of any cancer he also has a broken tooth and sinus infection      Assessment & Plan:  Viral syndrome Secondary rhinosinusitis No sign and pneumonia Antibiotics prescribed warning signs discussed follow-up if ongoing troubles Area in the left anterior neck region does not appear to be of growth if ongoing troubles follow-up with ENT Hydrocodone for  broken tooth caution drowsiness do not use with Xanax do not use frequently #20 given no refills

## 2017-09-21 ENCOUNTER — Other Ambulatory Visit: Payer: Self-pay | Admitting: *Deleted

## 2017-09-21 ENCOUNTER — Telehealth: Payer: Self-pay | Admitting: Family Medicine

## 2017-09-21 MED ORDER — HYDROCODONE-ACETAMINOPHEN 5-325 MG PO TABS: 1.0000 | ORAL_TABLET | Freq: Four times a day (QID) | ORAL | 0 refills | Status: DC | PRN

## 2017-09-21 NOTE — Telephone Encounter (Signed)
Patient is requesting refill on hydrocodone  °

## 2017-09-21 NOTE — Telephone Encounter (Signed)
He may have a refill, 20 tablets, one every 6 hours when necessary severe pain caution drowsiness, not for frequent or long-term use, if ongoing troubles with dental it is important for him to see the dentist and they will manage this. We will not be able to ongoing prescribed pain medicine for this issue

## 2017-09-21 NOTE — Telephone Encounter (Signed)
Discussed with pt. Pt verbalized understanding. Script ready for pickup.

## 2018-06-03 ENCOUNTER — Encounter: Payer: Self-pay | Admitting: Family Medicine

## 2018-06-03 ENCOUNTER — Ambulatory Visit: Payer: No Typology Code available for payment source | Admitting: Family Medicine

## 2018-06-03 VITALS — BP 108/70 | Temp 99.0°F | Ht 70.0 in | Wt 117.8 lb

## 2018-06-03 DIAGNOSIS — R198 Other specified symptoms and signs involving the digestive system and abdomen: Secondary | ICD-10-CM | POA: Diagnosis not present

## 2018-06-03 MED ORDER — AMOXICILLIN-POT CLAVULANATE 875-125 MG PO TABS: 1.0000 | ORAL_TABLET | Freq: Two times a day (BID) | ORAL | 0 refills | Status: DC

## 2018-06-03 MED ORDER — MUPIROCIN 2 % EX OINT: TOPICAL_OINTMENT | CUTANEOUS | 1 refills | Status: DC

## 2018-06-03 NOTE — Progress Notes (Signed)
   Subjective:    Patient ID: Shane Butler, male    DOB: [birth year], 37 y.o.   MRN: [id number]  HPIstabbing pain around navel and having some bleeding. Tried antibiotic cream, hydrogen peroxide, alcholol,   He has had some intermittent areas of redness crusting some drainage foul odor denies high fever chills sweats pain discomfort  Review of Systems Please see above    Objective:   Physical Exam Lungs clear heart regular abdomen with umbilical area with some slight tenderness there is no redness with it it appears to be a localized infection no masses seen       Assessment & Plan:  Localized infection of the navel recommend antibiotics and antibiotic ointment if it persists referral for further evaluation with gastroenterology or general surgery I do not feel imaging is necessary currently

## 2018-06-04 ENCOUNTER — Telehealth: Payer: Self-pay | Admitting: Family Medicine

## 2018-06-04 ENCOUNTER — Other Ambulatory Visit: Payer: Self-pay | Admitting: Nurse Practitioner

## 2018-06-04 MED ORDER — BENZONATATE 100 MG PO CAPS: 100.0000 mg | ORAL_CAPSULE | Freq: Three times a day (TID) | ORAL | 0 refills | Status: DC | PRN

## 2018-06-04 NOTE — Telephone Encounter (Signed)
Given Augmentin 06/03/18

## 2018-06-04 NOTE — Telephone Encounter (Signed)
Was seen for other issue. Will send in tessalon perles. Call back next week if no improvement.

## 2018-06-04 NOTE — Telephone Encounter (Signed)
Pt seen by Dr. Nicki Reaper 06/03/18, states he was told to let us know if his cough was no better today  It is no better, worse, kept him up most of last night,   Please advise    [employer]

## 2018-06-04 NOTE — Telephone Encounter (Signed)
Cough at night- requesting cough med

## 2018-06-13 ENCOUNTER — Other Ambulatory Visit: Payer: Self-pay

## 2018-06-13 ENCOUNTER — Emergency Department (HOSPITAL_COMMUNITY)
Admission: EM | Admit: 2018-06-13 | Discharge: 2018-06-13 | Disposition: A | Discharge status: Home / Self Care | Payer: No Typology Code available for payment source | Attending: Emergency Medicine | Admitting: Emergency Medicine

## 2018-06-13 ENCOUNTER — Encounter (HOSPITAL_COMMUNITY): Payer: Self-pay | Admitting: Emergency Medicine

## 2018-06-13 DIAGNOSIS — T310 Burns involving less than 10% of body surface: Secondary | ICD-10-CM | POA: Insufficient documentation

## 2018-06-13 DIAGNOSIS — Y939 Activity, unspecified: Secondary | ICD-10-CM | POA: Insufficient documentation

## 2018-06-13 DIAGNOSIS — T23201A Burn of second degree of right hand, unspecified site, initial encounter: Secondary | ICD-10-CM | POA: Insufficient documentation

## 2018-06-13 DIAGNOSIS — Y999 Unspecified external cause status: Secondary | ICD-10-CM | POA: Insufficient documentation

## 2018-06-13 DIAGNOSIS — X102XXA Contact with fats and cooking oils, initial encounter: Secondary | ICD-10-CM | POA: Insufficient documentation

## 2018-06-13 DIAGNOSIS — F17210 Nicotine dependence, cigarettes, uncomplicated: Secondary | ICD-10-CM | POA: Insufficient documentation

## 2018-06-13 DIAGNOSIS — Y9259 Other trade areas as the place of occurrence of the external cause: Secondary | ICD-10-CM | POA: Insufficient documentation

## 2018-06-13 DIAGNOSIS — Z23 Encounter for immunization: Secondary | ICD-10-CM | POA: Insufficient documentation

## 2018-06-13 MED ORDER — HYDROCODONE-ACETAMINOPHEN 5-325 MG PO TABS
1.0000 | ORAL_TABLET | Freq: Once | ORAL | Status: AC
  Administered 2018-06-13: 1 via ORAL
  Filled 2018-06-13: qty 1

## 2018-06-13 MED ORDER — CEPHALEXIN 500 MG PO CAPS: 500.0000 mg | ORAL_CAPSULE | Freq: Four times a day (QID) | ORAL | 0 refills | Status: DC

## 2018-06-13 MED ORDER — IBUPROFEN 800 MG PO TABS
800.0000 mg | ORAL_TABLET | Freq: Once | ORAL | Status: AC
  Administered 2018-06-13: 800 mg via ORAL
  Filled 2018-06-13: qty 1

## 2018-06-13 MED ORDER — PROMETHAZINE HCL 12.5 MG PO TABS
12.5000 mg | ORAL_TABLET | Freq: Once | ORAL | Status: AC
  Administered 2018-06-13: 12.5 mg via ORAL
  Filled 2018-06-13: qty 1

## 2018-06-13 MED ORDER — CEPHALEXIN 500 MG PO CAPS
500.0000 mg | ORAL_CAPSULE | Freq: Once | ORAL | Status: AC
  Administered 2018-06-13: 500 mg via ORAL
  Filled 2018-06-13: qty 1

## 2018-06-13 MED ORDER — IBUPROFEN 600 MG PO TABS: 600.0000 mg | ORAL_TABLET | Freq: Four times a day (QID) | ORAL | 0 refills | Status: DC

## 2018-06-13 MED ORDER — OXYCODONE-ACETAMINOPHEN 5-325 MG PO TABS: 1.0000 | ORAL_TABLET | Freq: Four times a day (QID) | ORAL | 0 refills | Status: DC | PRN

## 2018-06-13 MED ORDER — TETANUS-DIPHTH-ACELL PERTUSSIS 5-2.5-18.5 LF-MCG/0.5 IM SUSP
0.5000 mL | Freq: Once | INTRAMUSCULAR | Status: AC
  Administered 2018-06-13: 0.5 mL via INTRAMUSCULAR
  Filled 2018-06-13: qty 0.5

## 2018-06-13 MED ORDER — BACITRACIN-NEOMYCIN-POLYMYXIN 400-5-5000 EX OINT
TOPICAL_OINTMENT | Freq: Once | CUTANEOUS | Status: AC
  Administered 2018-06-13: 1 via TOPICAL
  Filled 2018-06-13: qty 5

## 2018-06-13 NOTE — Discharge Instructions (Addendum)
Your examination reveals first and second-degree burns involving your right hand.  This will need careful follow-up.  Please call Dr. Wolfgang Phoenix on tomorrow, July 8 to schedule an appointment within the next 24 to 48 hours.  Please return to the emergency department if you unable to be evaluated within the next 48 hours.  Please use Keflex and ibuprofen with breakfast, lunch, dinner, and at bedtime.  Use Percocet every 6 hours for more severe pain.  Please see Dr. Wolfgang Phoenix for any additional pain management.  Please apply a Neosporin dressing daily to the right hand and fingers.

## 2018-06-13 NOTE — ED Provider Notes (Signed)
[facility] EMERGENCY DEPARTMENT Provider Note   CSN: [id number] Arrival date & time: 06/13/18  1608     History   Chief Complaint Chief Complaint  Patient presents with  . Hand Burn    HPI Shane Butler is a 37 y.o. male.  Patient is a 37 year old male who presents to the emergency department following a burn to the right hand.  Patient states that he was at work today.  He states that he slipped, and in order to try to break his fall he reached to grab something and unfortunately put his hand in a deep prior.  He removed his hand immediately but noted red blisters and pain involving his right hand.  The pain in the burns did not seem to go beyond the wrist at this time.  No other areas of burn reported.  The patient states he is not sure the date of his last tetanus shot.  He has not had any recent operations or procedures involving the right hand.     Past Medical History:  Diagnosis Date  . Anxiety   . Depression     Patient Active Problem List   Diagnosis Date Noted  . Gastroesophageal reflux disease without esophagitis 02/17/2017    Past Surgical History:  Procedure Laterality Date  . WISDOM TOOTH EXTRACTION          Home Medications    Prior to Admission medications   Medication Sig Start Date End Date Taking? Authorizing Provider  alprazolam Duanne Moron) 2 MG tablet Take 2 mg by mouth at bedtime as needed for sleep.    [provider]  amoxicillin (AMOXIL) 500 MG tablet Take 1 tablet (500 mg total) by mouth 3 (three) times daily. Patient not taking: Reported on 06/03/2018 09/14/17   Kathyrn Drown, MD  amoxicillin-clavulanate (AUGMENTIN) 875-125 MG tablet Take 1 tablet by mouth 2 (two) times daily. 06/03/18   Kathyrn Drown, MD  Amphetamine-Dextroamphetamine (ADDERALL PO) Take by mouth.    [provider]  benzonatate (TESSALON) 100 MG capsule Take 1 capsule (100 mg total) by mouth 3 (three) times daily as needed for cough. 06/04/18   Nilda Simmer, NP  cetirizine (ZYRTEC) 10 MG tablet Take 10 mg by mouth daily.    [provider]  ChlordiazePOXIDE HCl (LIBRIUM PO) Take by mouth.    [provider]  HYDROcodone-acetaminophen (NORCO/VICODIN) 5-325 MG tablet Take 1 tablet by mouth every 6 (six) hours as needed. Take for severe pain. Caution drowiness Patient not taking: Reported on 06/03/2018 09/21/17   Kathyrn Drown, MD  mupirocin ointment (BACTROBAN) 2 % Apply to affected area 2 times daily 06/03/18 06/03/19  Kathyrn Drown, MD  sertraline (ZOLOFT) 100 MG tablet Take 100 mg by mouth daily.    [provider]    Family History No family history on file.  Social History Social History   Tobacco Use  . Smoking status: Current Every Day Smoker    Packs/day: 0.25    Types: Cigarettes  . Smokeless tobacco: Never Used  Substance Use Topics  . Alcohol use: Yes    Alcohol/week: 16.8 oz    Types: 28 Cans of beer per week    Comment: drinks 4 cans of beer each night  . Drug use: Not Currently     Allergies   Ciprofloxacin and Other   Review of Systems Review of Systems  Constitutional: Negative for activity change.       All ROS Neg except  as noted in HPI  HENT: Negative for nosebleeds.   Eyes: Negative for photophobia and discharge.  Respiratory: Negative for cough, shortness of breath and wheezing.   Cardiovascular: Negative for chest pain and palpitations.  Gastrointestinal: Negative for abdominal pain and blood in stool.  Genitourinary: Negative for dysuria, frequency and hematuria.  Musculoskeletal: Negative for arthralgias, back pain and neck pain.       Right hand pain  Skin: Negative.   Neurological: Negative for dizziness, seizures and speech difficulty.  Psychiatric/Behavioral: Negative for confusion and hallucinations.     Physical Exam Updated Vital Signs BP (!) 133/94 (BP Location: Left Arm)   Pulse 67   Temp 98 F (36.7 C) (Oral)   Resp 18   Ht 5\' 10"  (1.778 m)    Wt 50.3 kg (111 lb)   SpO2 98%   BMI 15.93 kg/m   Physical Exam  Constitutional: He is oriented to person, place, and time. He appears well-developed and well-nourished.  Non-toxic appearance.  HENT:  Head: Normocephalic.  Right Ear: Tympanic membrane and external ear normal.  Left Ear: Tympanic membrane and external ear normal.  Eyes: Pupils are equal, round, and reactive to light. EOM and lids are normal.  Neck: Normal range of motion. Neck supple. Carotid bruit is not present.  Cardiovascular: Normal rate, regular rhythm, normal heart sounds, intact distal pulses and normal pulses.  Pulmonary/Chest: Breath sounds normal. No respiratory distress.  Abdominal: Soft. Bowel sounds are normal. There is no tenderness. There is no guarding.  Musculoskeletal: Normal range of motion.  There is full range of motion of the right shoulder, elbow, and wrist.  There are no burns involving these areas of the right upper extremity.  There are first and second-degree burns involving the fingers of the right hand.  No blisters or pain noted of the palm of the right hand.  There is only one area between the middle finger and index finger webspace that seems to be involved.  Lymphadenopathy:       Head (right side): No submandibular adenopathy present.       Head (left side): No submandibular adenopathy present.    He has no cervical adenopathy.  Neurological: He is alert and oriented to person, place, and time. He has normal strength. No cranial nerve deficit or sensory deficit.  Skin: Skin is warm and dry.  Psychiatric: He has a normal mood and affect. His speech is normal.  Nursing note and vitals reviewed.    ED Treatments / Results  Labs (all labs ordered are listed, but only abnormal results are displayed) Labs Reviewed - No data to display  EKG None  Radiology No results found.  Procedures Procedures (including critical care time)  Medications Ordered in ED Medications    HYDROcodone-acetaminophen (NORCO/VICODIN) 5-325 MG per tablet 1 tablet (1 tablet Oral Given 06/13/18 1705)  ibuprofen (ADVIL,MOTRIN) tablet 800 mg (800 mg Oral Given 06/13/18 1705)  promethazine (PHENERGAN) tablet 12.5 mg (12.5 mg Oral Given 06/13/18 1705)     Initial Impression / Assessment and Plan / ED Course  I have reviewed the triage vital signs and the nursing notes.  Pertinent labs & imaging results that were available during my care of the patient were reviewed by me and considered in my medical decision making (see chart for details).       Final Clinical Impressions(s) / ED Diagnoses  MDM Right hand soaked to cool saline with improvement in pain. Norco, and ibuprofen given for pain. Vital signs  reviewed.  Pulse oximetry is 98% on room air.  Within normal limits by my interpretation.  The patient sustained first and second-degree burns involving the fingers primarily of the right hand.  He has good range of motion of the fingers, but with pain.  There is only one area of webspace that seems to be involved.  There is good range of motion of the wrist and elbow on the right.  The patient's tetanus status was updated.  The patient will be placed prophylactically on Keflex, and he will be given Percocet and ibuprofen to use for his pain and discomfort.  The patient is to be followed up by Dr. Wolfgang Phoenix in the office next week.  I have asked the patient to return to the emergency department if this is not possible.   Final diagnoses:  Partial thickness burn of multiple sites of right hand, initial encounter    ED Discharge Orders        Ordered    cephALEXin (KEFLEX) 500 MG capsule  4 times daily     06/13/18 1730    oxyCODONE-acetaminophen (PERCOCET/ROXICET) 5-325 MG tablet  Every 6 hours PRN     06/13/18 1730    ibuprofen (ADVIL,MOTRIN) 600 MG tablet  4 times daily     06/13/18 1730       Lily Kocher, PA-C 06/13/18 1736    Nat Christen, MD 06/14/18 1123

## 2018-06-13 NOTE — ED Triage Notes (Signed)
At work today, pt slipped and right hand went into deep fryer to catch himself. Red and blisters.

## 2018-06-14 ENCOUNTER — Ambulatory Visit: Payer: No Typology Code available for payment source | Admitting: Family Medicine

## 2018-06-14 ENCOUNTER — Encounter: Payer: Self-pay | Admitting: Family Medicine

## 2018-06-14 ENCOUNTER — Other Ambulatory Visit: Payer: Self-pay | Admitting: *Deleted

## 2018-06-14 VITALS — BP 120/82 | Temp 98.1°F | Ht 70.0 in | Wt 123.0 lb

## 2018-06-14 DIAGNOSIS — M79641 Pain in right hand: Secondary | ICD-10-CM | POA: Diagnosis not present

## 2018-06-14 DIAGNOSIS — T23101A Burn of first degree of right hand, unspecified site, initial encounter: Secondary | ICD-10-CM | POA: Diagnosis not present

## 2018-06-14 DIAGNOSIS — X12XXXA Contact with other hot fluids, initial encounter: Principal | ICD-10-CM

## 2018-06-14 DIAGNOSIS — T300 Burn of unspecified body region, unspecified degree: Secondary | ICD-10-CM

## 2018-06-14 MED ORDER — MUPIROCIN 2 % EX OINT: TOPICAL_OINTMENT | CUTANEOUS | 0 refills | Status: DC

## 2018-06-14 NOTE — Progress Notes (Signed)
   Subjective:    Patient ID: Shane Butler, male    DOB: 01/23/1981, 37 y.o.   MRN: [id number]  HPI  Patient is here today to follow up ed visit from yesterday for a burn he obtained on his hand at work while cooking. He is a Administrator, arts at [employer].  Pt was cooking , slipped on an onion on the floror  Tried to ctch himself  And hand monentarily slipped into the fryer Review of Systems No headache, no major weight loss or weight gain, no chest pain no back pain abdominal pain no change in bowel habits complete ROS otherwise negative     Objective:   Physical Exam Alert vitals stable, NAD. Blood pressure good on repeat. HEENT normal. Lungs clear. Heart regular rate and rhythm. Right hand impressive blister intact on right fourth finger patient was draped cleansed with Betadine blister incised and drained  Partial epithelial burn wound management discussed.  Bactroban twice daily to affected area.  Expect gradual resolution follow-up Scott and and less significantly improved       Assessment & Plan:

## 2018-06-18 ENCOUNTER — Telehealth: Payer: Self-pay | Admitting: Family Medicine

## 2018-06-18 MED ORDER — OXYCODONE-ACETAMINOPHEN 5-325 MG PO TABS: 1.0000 | ORAL_TABLET | Freq: Four times a day (QID) | ORAL | 0 refills | Status: DC | PRN

## 2018-06-18 MED ORDER — CEPHALEXIN 500 MG PO CAPS: ORAL_CAPSULE | ORAL | 0 refills | Status: DC

## 2018-06-18 NOTE — Telephone Encounter (Signed)
Ok 12 of the oxy's same dose as before, and seven d worth of the keflex

## 2018-06-18 NOTE — Telephone Encounter (Signed)
Pt is requesting refills on oxyCODONE-acetaminophen (PERCOCET/ROXICET) 5-325 MG tablet and cephALEXin (KEFLEX) 500 MG capsule. Please send to [facility], [place] AT [address]. He would also like to know if he should pop the blister that is ready to pop. He also wants to let Dr. Richardson Landry know the ibuprofen is helping with the swelling but not the pain. He has been working since being in the office on 06/14/18.

## 2018-06-18 NOTE — Telephone Encounter (Signed)
Refill ordered and script printed;awaiting signature.

## 2018-06-18 NOTE — Telephone Encounter (Signed)
Pt.notified

## 2018-06-22 ENCOUNTER — Encounter: Payer: Self-pay | Admitting: Family Medicine

## 2018-06-22 ENCOUNTER — Ambulatory Visit: Payer: No Typology Code available for payment source | Admitting: Family Medicine

## 2018-06-22 ENCOUNTER — Ambulatory Visit (HOSPITAL_COMMUNITY)
Admission: RE | Admit: 2018-06-22 | Discharge: 2018-06-22 | Disposition: A | Discharge status: Home / Self Care | Payer: No Typology Code available for payment source | Source: Ambulatory Visit | Attending: Family Medicine | Admitting: Family Medicine

## 2018-06-22 VITALS — BP 122/80 | Temp 98.5°F | Ht 70.0 in | Wt 116.0 lb

## 2018-06-22 DIAGNOSIS — T300 Burn of unspecified body region, unspecified degree: Secondary | ICD-10-CM | POA: Diagnosis not present

## 2018-06-22 DIAGNOSIS — R05 Cough: Secondary | ICD-10-CM | POA: Insufficient documentation

## 2018-06-22 DIAGNOSIS — R053 Chronic cough: Secondary | ICD-10-CM

## 2018-06-22 DIAGNOSIS — X12XXXA Contact with other hot fluids, initial encounter: Principal | ICD-10-CM

## 2018-06-22 MED ORDER — HYDROCODONE-ACETAMINOPHEN 5-325 MG PO TABS: 1.0000 | ORAL_TABLET | Freq: Four times a day (QID) | ORAL | 0 refills | Status: DC | PRN
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
## 2018-11-18 ENCOUNTER — Ambulatory Visit: Payer: No Typology Code available for payment source | Admitting: Family Medicine

## 2018-11-18 ENCOUNTER — Encounter: Payer: Self-pay | Admitting: Family Medicine

## 2018-11-18 VITALS — Temp 97.6°F | Ht 70.0 in | Wt 121.0 lb

## 2018-11-18 DIAGNOSIS — L02211 Cutaneous abscess of abdominal wall: Secondary | ICD-10-CM | POA: Diagnosis not present

## 2018-11-18 MED ORDER — DOXYCYCLINE HYCLATE 100 MG PO TABS: 100.0000 mg | ORAL_TABLET | Freq: Two times a day (BID) | ORAL | 0 refills | Status: DC

## 2018-11-18 NOTE — Progress Notes (Signed)
   Subjective:    Patient ID: Shane Butler, male    DOB: [birth year], 37 y.o.   MRN: [id number]  HPI Pt here today for navel irritation. Pt states about 3 days ago his navel began to hurt. Had oozing of dark brown pus this morning. Was swollen and tender. States that since oozing, navel has went down in size. Pt has put Bactroban ointment. Pt states the navel was swollen so bad that he could not get Q Tip into navel. Pt states he also has been having cough for about one week.   Patient also notes cough.  1 week duration.  Some productive nature to it.   No headache no chest pain no fever no chills Review of Systems    Objective:   Physical Exam  Alert active good hydration HEENT denies congestion pharynx normal lungs bronchial cough heart regular rate and rhythm.  Periumbilical abscess noted no fluctuance.  Active discharge.  Impression abscess with discharge local measures discussed.  Antibiotics prescribed.  Warning signs discussed  Element of rhinosinusitis/bronchitis.      Assessment & Plan:

## 2019-01-06 ENCOUNTER — Other Ambulatory Visit: Payer: Self-pay

## 2019-01-06 ENCOUNTER — Encounter (HOSPITAL_COMMUNITY): Payer: Self-pay | Admitting: Emergency Medicine

## 2019-01-06 ENCOUNTER — Emergency Department (HOSPITAL_COMMUNITY)
Admission: EM | Admit: 2019-01-06 | Discharge: 2019-01-07 | Disposition: A | Discharge status: Home / Self Care | Payer: No Typology Code available for payment source | Attending: Emergency Medicine | Admitting: Emergency Medicine

## 2019-01-06 DIAGNOSIS — L03316 Cellulitis of umbilicus: Secondary | ICD-10-CM | POA: Insufficient documentation

## 2019-01-06 DIAGNOSIS — L02219 Cutaneous abscess of trunk, unspecified: Secondary | ICD-10-CM | POA: Insufficient documentation

## 2019-01-06 DIAGNOSIS — L0291 Cutaneous abscess, unspecified: Secondary | ICD-10-CM

## 2019-01-06 DIAGNOSIS — F17210 Nicotine dependence, cigarettes, uncomplicated: Secondary | ICD-10-CM | POA: Insufficient documentation

## 2019-01-06 DIAGNOSIS — Z79899 Other long term (current) drug therapy: Secondary | ICD-10-CM | POA: Insufficient documentation

## 2019-01-06 MED ORDER — SULFAMETHOXAZOLE-TRIMETHOPRIM 800-160 MG PO TABS
1.0000 | ORAL_TABLET | Freq: Once | ORAL | Status: AC
  Administered 2019-01-06: 1 via ORAL
  Filled 2019-01-06: qty 1

## 2019-01-06 MED ORDER — OXYCODONE-ACETAMINOPHEN 5-325 MG PO TABS
1.0000 | ORAL_TABLET | Freq: Once | ORAL | Status: AC
  Administered 2019-01-06: 1 via ORAL
  Filled 2019-01-06: qty 1

## 2019-01-06 MED ORDER — LIDOCAINE HCL (PF) 1 % IJ SOLN
5.0000 mL | Freq: Once | INTRAMUSCULAR | Status: AC
  Administered 2019-01-07: 5 mL via INTRADERMAL
  Filled 2019-01-06: qty 6

## 2019-01-06 NOTE — ED Triage Notes (Signed)
Pt c/o abscess to umbilical area since Nov, pt reports he saw PCP for same and was prescribed antibiotics and a topical cream, reports some improvement but never went away, abscess was draining but has stopped and has now become hardened, red, tender to touch and painful x 2 days ago

## 2019-01-06 NOTE — ED Notes (Signed)
Lac tray and lidocaine at bedside

## 2019-01-07 MED ORDER — OXYCODONE-ACETAMINOPHEN 5-325 MG PO TABS: 1.0000 | ORAL_TABLET | Freq: Three times a day (TID) | ORAL | 0 refills | Status: DC | PRN

## 2019-01-07 MED ORDER — SULFAMETHOXAZOLE-TRIMETHOPRIM 800-160 MG PO TABS: 1.0000 | ORAL_TABLET | Freq: Two times a day (BID) | ORAL | 0 refills | Status: DC

## 2019-01-07 NOTE — ED Provider Notes (Signed)
[facility] EMERGENCY DEPARTMENT Provider Note   CSN: [id number] Arrival date & time: 01/06/19  1916     History   Chief Complaint Chief Complaint  Patient presents with  . Abscess    HPI Shane Butler is a 38 y.o. male.   Abscess  Location:  Torso Torso abscess location: right side of umbilicus. Size:  2x1 Abscess quality: fluctuance, painful, redness and warmth   Red streaking: no   Pain details:    Quality:  Sharp and pressure   Severity:  Moderate   Timing:  Constant Chronicity:  New Relieved by:  None tried   Past Medical History:  Diagnosis Date  . Anxiety   . Depression     Patient Active Problem List   Diagnosis Date Noted  . Gastroesophageal reflux disease without esophagitis 02/17/2017    Past Surgical History:  Procedure Laterality Date  . WISDOM TOOTH EXTRACTION          Home Medications    Prior to Admission medications   Medication Sig Start Date End Date Taking? Authorizing Provider  alprazolam Duanne Moron) 2 MG tablet Take 2 mg by mouth 2 (two) times daily.    Yes [provider]  amphetamine-dextroamphetamine (ADDERALL) 20 MG tablet Take 20 mg by mouth 3 (three) times daily.    Yes [provider]  cetirizine (ZYRTEC) 10 MG tablet Take 10 mg by mouth daily as needed for allergies or rhinitis.    Yes [provider]  chlordiazePOXIDE (LIBRIUM) 25 MG capsule Take 25 mg by mouth daily.   Yes [provider]  HYDROcodone-acetaminophen (NORCO) 10-325 MG tablet Take 1 tablet by mouth every 6 (six) hours as needed for moderate pain or severe pain.   Yes [provider]  ibuprofen (ADVIL,MOTRIN) 200 MG tablet Take 600 mg by mouth daily as needed for mild pain or moderate pain.   Yes [provider]  mupirocin ointment (BACTROBAN) 2 % Apply to affected area 2 times daily Patient taking differently: Apply 1 application topically daily as needed (to affected area(s)).  06/03/18 06/03/19 Yes Luking,  Elayne Snare, MD  naproxen sodium (ALEVE) 220 MG tablet Take 220 mg by mouth daily as needed (for pain).   Yes [provider]  sertraline (ZOLOFT) 100 MG tablet Take 100 mg by mouth daily.   Yes [provider]  oxyCODONE-acetaminophen (PERCOCET) 5-325 MG tablet Take 1-2 tablets by mouth every 8 (eight) hours as needed for severe pain. 01/06/19   Taylar Hartsough, Corene Cornea, MD  sulfamethoxazole-trimethoprim (BACTRIM DS,SEPTRA DS) 800-160 MG tablet Take 1 tablet by mouth 2 (two) times daily for 7 days. 01/07/19 01/14/19  Rafeal Skibicki, Corene Cornea, MD    Family History History reviewed. No pertinent family history.  Social History Social History   Tobacco Use  . Smoking status: Current Every Day Smoker    Packs/day: 0.25    Types: Cigarettes  . Smokeless tobacco: Never Used  Substance Use Topics  . Alcohol use: Yes    Alcohol/week: 28.0 standard drinks    Types: 28 Cans of beer per week    Comment: drinks 4 cans of beer each night  . Drug use: Not Currently     Allergies   Ciprofloxacin and Other   Review of Systems Review of Systems  All other systems reviewed and are negative.    Physical Exam Updated Vital Signs BP 126/90 (BP Location: Right Arm)   Pulse 62   Temp 98.3 F (36.8 C) (Oral)   Resp 17  Ht 5\' 11"  (1.803 m)   Wt 54.4 kg   SpO2 99%   BMI 16.74 kg/m   Physical Exam Vitals signs and nursing note reviewed. Exam conducted with a chaperone present.  Constitutional:      Appearance: He is well-developed.  HENT:     Head: Normocephalic and atraumatic.  Neck:     Musculoskeletal: Normal range of motion.  Cardiovascular:     Rate and Rhythm: Normal rate.  Pulmonary:     Effort: Pulmonary effort is normal. No respiratory distress.  Abdominal:     General: There is no distension.  Musculoskeletal: Normal range of motion.  Skin:    Findings: Rash (and swelling to right side of umbilicus with fluctuance and surrounding induration/erythema/warsmth) present.    Neurological:     Mental Status: He is alert.      ED Treatments / Results  Labs (all labs ordered are listed, but only abnormal results are displayed) Labs Reviewed - No data to display  EKG None  Radiology No results found.  Procedures .Marland KitchenIncision and Drainage Date/Time: 01/07/2019 11:17 PM Performed by: Merrily Pew, MD Authorized by: Merrily Pew, MD   Consent:    Consent obtained:  Verbal   Consent given by:  Patient   Risks discussed:  Bleeding and incomplete drainage   Alternatives discussed:  No treatment Location:    Type:  Abscess   Size:  2x1   Location:  Trunk   Trunk location:  Abdomen Pre-procedure details:    Skin preparation:  Betadine Anesthesia (see MAR for exact dosages):    Anesthesia method:  Local infiltration   Local anesthetic:  Lidocaine 1% w/o epi Procedure type:    Complexity:  Simple Procedure details:    Incision types:  Single straight   Scalpel blade:  11   Wound management:  Irrigated with saline   Drainage:  Bloody and purulent   Packing materials:  None Post-procedure details:    Patient tolerance of procedure:  Tolerated well, no immediate complications   (including critical care time)  Medications Ordered in ED Medications  lidocaine (PF) (XYLOCAINE) 1 % injection 5 mL (5 mLs Intradermal Given 01/07/19 0000)  oxyCODONE-acetaminophen (PERCOCET/ROXICET) 5-325 MG per tablet 1 tablet (1 tablet Oral Given 01/06/19 2357)  sulfamethoxazole-trimethoprim (BACTRIM DS,SEPTRA DS) 800-160 MG per tablet 1 tablet (1 tablet Oral Given 01/06/19 2357)     Initial Impression / Assessment and Plan / ED Course  I have reviewed the triage vital signs and the nursing notes.  Pertinent labs & imaging results that were available during my care of the patient were reviewed by me and considered in my medical decision making (see chart for details).     Abscess and cellulitis. Bactrim. Pain meds. Dc w/ pcp follow up.   Final Clinical  Impressions(s) / ED Diagnoses   Final diagnoses:  Cellulitis of umbilicus  Abscess    ED Discharge Orders         Ordered    sulfamethoxazole-trimethoprim (BACTRIM DS,SEPTRA DS) 800-160 MG tablet  2 times daily     01/07/19 0000    oxyCODONE-acetaminophen (PERCOCET) 5-325 MG tablet  Every 8 hours PRN     01/07/19 0000    sulfamethoxazole-trimethoprim (BACTRIM DS,SEPTRA DS) 800-160 MG tablet  2 times daily,   Status:  Discontinued     01/07/19 0000           Sheniece Ruggles, Corene Cornea, MD 01/07/19 2319

## 2019-01-13 ENCOUNTER — Encounter: Payer: Self-pay | Admitting: Family Medicine

## 2019-01-13 ENCOUNTER — Ambulatory Visit (INDEPENDENT_AMBULATORY_CARE_PROVIDER_SITE_OTHER): Payer: Self-pay | Admitting: Family Medicine

## 2019-01-13 VITALS — BP 112/82 | Temp 98.2°F | Ht 70.0 in | Wt 126.0 lb

## 2019-01-13 DIAGNOSIS — L03316 Cellulitis of umbilicus: Secondary | ICD-10-CM

## 2019-01-13 MED ORDER — DOXYCYCLINE HYCLATE 100 MG PO TABS: 100.0000 mg | ORAL_TABLET | Freq: Two times a day (BID) | ORAL | 0 refills | Status: DC

## 2019-01-13 MED ORDER — OXYCODONE-ACETAMINOPHEN 5-325 MG PO TABS: 1.0000 | ORAL_TABLET | Freq: Three times a day (TID) | ORAL | 0 refills | Status: DC | PRN

## 2019-01-13 NOTE — Progress Notes (Signed)
   Subjective:    Patient ID: Shane Butler, male    DOB: 05/04/1981, 38 y.o.   MRN: [id number]  HPI  Patient is here today to follow up on a recent hospital visit from 01/06/2019 with Cellulitis pf umbilicus. To some degree patient has been having this off and on several different times over the past 6 months Recently it became inflamed red tender swollen went to the ER states it was draining they did try Patient states they drained the area,but it keeps filling up with fluid and it is painful.  He states he is draining it himself 2-3 times per day.  Review of Systems  Constitutional: Negative for activity change, appetite change and fatigue.  HENT: Negative for congestion and rhinorrhea.   Respiratory: Negative for cough and shortness of breath.   Cardiovascular: Negative for chest pain and leg swelling.  Gastrointestinal: Negative for abdominal pain, nausea and vomiting.  Neurological: Negative for dizziness and headaches.  Psychiatric/Behavioral: Negative for agitation and behavioral problems.       Objective:   Physical Exam Vitals signs reviewed.  Cardiovascular:     Rate and Rhythm: Normal rate and regular rhythm.     Heart sounds: Normal heart sounds. No murmur.  Pulmonary:     Effort: Pulmonary effort is normal.     Breath sounds: Normal breath sounds.  Lymphadenopathy:     Cervical: No cervical adenopathy.  Neurological:     Mental Status: He is alert.  Psychiatric:        Behavior: Behavior normal.    Umbilical abscess noted.  Has a couple draining holes.  Not draining currently.  Tender red approximately an inch diameter by half inch by half inch       Assessment & Plan:  Umbilical infection Probable abscess Cannot tell if there is underlying hernia I did discuss the case with Dr. Aviva Signs They will see him next week We did attempt to drain this but was unsuccessful.  It was felt that further or deeper exploration would not be wise in the  office Patient did not have any complications He had already taken Bactrim without much help so we switched him to doxycycline He will see Dr. Arnoldo Morale next Tuesday at 1:15 PM-patient aware

## 2019-01-18 ENCOUNTER — Ambulatory Visit (INDEPENDENT_AMBULATORY_CARE_PROVIDER_SITE_OTHER): Payer: Self-pay | Admitting: General Surgery

## 2019-01-18 ENCOUNTER — Encounter: Payer: Self-pay | Admitting: General Surgery

## 2019-01-18 VITALS — BP 119/78 | HR 79 | Temp 97.8°F | Resp 18 | Wt 123.8 lb

## 2019-01-18 DIAGNOSIS — K429 Umbilical hernia without obstruction or gangrene: Secondary | ICD-10-CM

## 2019-01-19 ENCOUNTER — Telehealth: Payer: Self-pay | Admitting: Family Medicine

## 2019-01-19 ENCOUNTER — Other Ambulatory Visit: Payer: Self-pay | Admitting: Family Medicine

## 2019-01-19 MED ORDER — OXYCODONE-ACETAMINOPHEN 5-325 MG PO TABS: 1.0000 | ORAL_TABLET | Freq: Four times a day (QID) | ORAL | 0 refills | Status: DC | PRN

## 2019-01-19 NOTE — Telephone Encounter (Signed)
Refill for oxyCODONE-acetaminophen (PERCOCET) 5-325 MG tablet, pt states he has no more of medication left from last refill, requesting enough to get patient to appt with Dr.Jenkins in March. Advise.    Pharmacy:  [place], [place]

## 2019-01-19 NOTE — Telephone Encounter (Signed)
Not for frequent use Refill sent to [facility] Caution drowsiness

## 2019-01-19 NOTE — Telephone Encounter (Signed)
Contacted patient and pt informed that medication was sent in to pharmacy, not for frequent use and caution for drowsiness. Pt verbalized understanding and states that he only uses the pain med when pain is at its worst.

## 2019-01-19 NOTE — Progress Notes (Signed)
Shane Butler; [id number]; 04-Aug-1981   HPI Patient is a 38 year old white male who was referred to my care by Dr. Sallee Lange for evaluation treatment of recurrent swelling and infection of the umbilicus.  Patient states that this has been present off and on for many years.  He recently finished a round of antibiotics due to cellulitis of the umbilicus.  He states he does have some swelling and pain in the base of the umbilicus when straining.  He currently has 2 out of 10 pain.  He has not had significant drainage from the umbilicus the past few days. Past Medical History:  Diagnosis Date  . Anxiety   . Depression     Past Surgical History:  Procedure Laterality Date  . WISDOM TOOTH EXTRACTION      History reviewed. No pertinent family history.  Current Outpatient Medications on File Prior to Visit  Medication Sig Dispense Refill  . alprazolam (XANAX) 2 MG tablet Take 2 mg by mouth 2 (two) times daily.     Marland Kitchen amphetamine-dextroamphetamine (ADDERALL) 20 MG tablet Take 20 mg by mouth 3 (three) times daily.     . cetirizine (ZYRTEC) 10 MG tablet Take 10 mg by mouth daily as needed for allergies or rhinitis.     . chlordiazePOXIDE (LIBRIUM) 25 MG capsule Take 25 mg by mouth daily.    Marland Kitchen doxycycline (VIBRA-TABS) 100 MG tablet Take 1 tablet (100 mg total) by mouth 2 (two) times daily. 20 tablet 0  . HYDROcodone-acetaminophen (NORCO) 10-325 MG tablet Take 1 tablet by mouth every 6 (six) hours as needed for moderate pain or severe pain.    . naproxen sodium (ALEVE) 220 MG tablet Take 220 mg by mouth daily as needed (for pain).    Marland Kitchen oxyCODONE-acetaminophen (PERCOCET) 5-325 MG tablet Take 1-2 tablets by mouth every 8 (eight) hours as needed for severe pain. 15 tablet 0  . sertraline (ZOLOFT) 100 MG tablet Take 100 mg by mouth daily.     No current facility-administered medications on file prior to visit.     Allergies  Allergen Reactions  . Ciprofloxacin Hives  . Other     Antibiotic  (patient does not know name of it)    Social History   Substance and Sexual Activity  Alcohol Use Yes  . Alcohol/week: 28.0 standard drinks  . Types: 28 Cans of beer per week   Comment: drinks 4 cans of beer each night    Social History   Tobacco Use  Smoking Status Current Every Day Smoker  . Packs/day: 0.25  . Types: Cigarettes  Smokeless Tobacco Never Used    Review of Systems  Constitutional: Negative.   HENT: Negative.   Eyes: Negative.   Respiratory: Negative.   Cardiovascular: Negative.   Gastrointestinal: Negative.   Genitourinary: Negative.   Musculoskeletal: Negative.   Skin: Negative.   Neurological: Negative.   Endo/Heme/Allergies: Negative.   Psychiatric/Behavioral: Negative.     Objective   Vitals:   01/18/19 1345  BP: 119/78  Pulse: 79  Resp: 18  Temp: 97.8 F (36.6 C)    Physical Exam Vitals signs reviewed.  Constitutional:      Appearance: Normal appearance. He is normal weight. He is not ill-appearing.  Cardiovascular:     Rate and Rhythm: Normal rate and regular rhythm.     Heart sounds: Normal heart sounds. No murmur. No friction rub. No gallop.   Pulmonary:     Effort: Pulmonary effort is normal. No respiratory distress.  Breath sounds: Normal breath sounds. No stridor. No wheezing, rhonchi or rales.  Abdominal:     General: Abdomen is flat. Bowel sounds are normal. There is no distension.     Palpations: Abdomen is soft. There is no mass.     Tenderness: There is no abdominal tenderness. There is no guarding or rebound.     Hernia: A hernia is present.     Comments: Small healed eschar noted along inferior aspect of umbilicus.  Small hernia defect appreciated at base of umbilicus.  Skin:    General: Skin is warm and dry.  Neurological:     Mental Status: He is alert and oriented to person, place, and time.   Primary care notes reviewed  Assessment  Umbilical hernia with recurrent cellulitis Plan   Given the recurrent  infections that the patient is experiencing, the resultant umbilical skin is somewhat tenuous.  I have suggested excision of the umbilicus with repair of the hernia.  He understands and agrees to this treatment plan.  Patient will call to schedule the surgery.  The risks and benefits of the procedure including bleeding, infection, mesh use, and the possibility of recurrence of the hernia were fully explained to the patient, who gave informed consent.

## 2019-01-19 NOTE — Telephone Encounter (Signed)
Patient checking on status of refill.

## 2019-01-19 NOTE — Telephone Encounter (Signed)
Please advise. Thank you

## 2019-03-15 ENCOUNTER — Ambulatory Visit (INDEPENDENT_AMBULATORY_CARE_PROVIDER_SITE_OTHER): Payer: 59 | Admitting: Family Medicine

## 2019-03-15 ENCOUNTER — Other Ambulatory Visit: Payer: Self-pay

## 2019-03-15 DIAGNOSIS — L03316 Cellulitis of umbilicus: Secondary | ICD-10-CM

## 2019-03-15 MED ORDER — OXYCODONE-ACETAMINOPHEN 5-325 MG PO TABS: 1.0000 | ORAL_TABLET | Freq: Four times a day (QID) | ORAL | 0 refills | Status: DC | PRN

## 2019-03-15 MED ORDER — DOXYCYCLINE HYCLATE 100 MG PO TABS: 100.0000 mg | ORAL_TABLET | Freq: Two times a day (BID) | ORAL | 0 refills | Status: DC

## 2019-03-15 NOTE — Progress Notes (Signed)
   Subjective:    Patient ID: Shane Butler, male    DOB: 1981-01-05, 38 y.o.   MRN: [id number]  HPI  Patient reports return of pain in belly button area. Patient states he is having a stabbing pain and having to wait on surgery due to [place] and needs a refill of pain medication.   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   I discussed the limitations of evaluation and management by telemedicine and the availability of in person appointments. The patient expressed understanding and agreed to proceed.  History of Present Illness:    Observations/Objective:   Assessment and Plan:   Follow Up Instructions:    I discussed the assessment and treatment plan with the patient. The patient was provided an opportunity to ask questions and all were answered. The patient agreed with the plan and demonstrated an understanding of the instructions.   The patient was advised to call back or seek an in-person evaluation if the symptoms worsen or if the condition fails to improve as anticipated.  I provided 15 minutes of non-face-to-face time during this encounter.  Patient states oxycodone does help her pain he denies abusing it he is requesting a refill he also states that intermittently this area gets red and tender so therefore we will go ahead and refill the antibiotic as well    Review of Systems     Objective:   Physical Exam   Unable to do physical exam via video  Unable to do fo skull exam viar    Assessment & Plan:  Umbilicus infection Pain medicine refill Antibiotic refill If progressive troubles or worse follow-up Follow through with surgeon once coronavirus settles down

## 2019-03-21 ENCOUNTER — Telehealth: Payer: Self-pay | Admitting: *Deleted

## 2019-03-21 NOTE — Telephone Encounter (Signed)
Left message to return call 

## 2019-03-21 NOTE — Telephone Encounter (Signed)
Seen last week  And he was told to call with an update. Pt states he drained the area and he has been keeping it clean. Pt did that yesterday morning. Been putting antibiotic cream on it. Pt states area looks better since he drained it yesterday. Still taking doxy.  Biggest problem he is having now is pain left shoulder and left arm and goes down to ring and pinky finger. Pt states pain is worse. Pt taking percocet as needed about 3 a day. It stops pain in navel but not helping arm and shoulder. Pt made appt with chiropractor today at 2:40.   [place] apoth.

## 2019-03-21 NOTE — Telephone Encounter (Signed)
As for the area in his arm I believe that that is more likely in a pinched nerve in his neck hopefully the chiropractor visit will help  Sometimes gabapentin is used if he has ongoing troubles he can let us know and we can try gabapentin Gabapentin can help reduce nerve related pain from a pinched nerve but if he starts experiencing weakness in the muscles in the arm then we will need to evaluate further

## 2019-03-22 ENCOUNTER — Other Ambulatory Visit: Payer: Self-pay | Admitting: *Deleted

## 2019-03-22 MED ORDER — CLINDAMYCIN HCL 300 MG PO CAPS: 300.0000 mg | ORAL_CAPSULE | Freq: Three times a day (TID) | ORAL | 0 refills | Status: DC

## 2019-03-22 MED ORDER — GABAPENTIN 100 MG PO CAPS: ORAL_CAPSULE | ORAL | 3 refills | Status: DC

## 2019-03-22 NOTE — Telephone Encounter (Signed)
Pt states he had brown drainage with bad odor come out of navel. Drainage had been just clean blood up until today. No fever. He is worried about the drainage.

## 2019-03-22 NOTE — Telephone Encounter (Signed)
Discussed with pt. Pt states chiropractor did help. He felt better after visit until he picked up something later that day and now pain is back like it was. He does want to try gabapentin.   Area in navel drained a lot this morning. Still taking doxy, no fever, area looking about the same.

## 2019-03-22 NOTE — Telephone Encounter (Signed)
I would recommend gabapentin 100 mg This medication can be beneficial but must be started at a low dose  Directions on the bottle 1 3 times daily, #90, 3 refills Please inform the patient this is a pain modulator which will reduce the amount of pain signals within the nerve It needs to be started slowly Start off with just 1 tablet each evening for the next 4 days Then 1 twice daily for the following 4 days Then may go up to 1 3 times daily  There is a possibility the medication can cause drowsiness or dizziness if so he should stick with lowest dose that helps him with the least amount of side effects  If he is able to tolerate this dose but it does not seem to be helping much over the next 10 days we can increase the dose further  I would recommend a virtual follow-up visit in 3 weeks

## 2019-03-22 NOTE — Telephone Encounter (Signed)
And clindamycin 300 mg 1 3 times daily for the next 7 days if this does not improve things the next step I would recommend his we will try to get him back in with the surgeon  The patient can keep [place] updated

## 2019-03-22 NOTE — Telephone Encounter (Signed)
Discussed both messages with pt and he verbalized understanding of all. meds sent to pharm.

## 2019-03-22 NOTE — Telephone Encounter (Signed)
Prescription sent electronically to pharmacy. Left message to return call to notify patient. 

## 2019-04-05 ENCOUNTER — Other Ambulatory Visit: Payer: Self-pay | Admitting: Family Medicine

## 2019-04-05 ENCOUNTER — Telehealth: Payer: Self-pay | Admitting: Family Medicine

## 2019-04-05 ENCOUNTER — Encounter: Payer: Self-pay | Admitting: Family Medicine

## 2019-04-05 MED ORDER — DICLOFENAC SODIUM 75 MG PO TBEC: DELAYED_RELEASE_TABLET | ORAL | 0 refills | Status: DC

## 2019-04-05 NOTE — Telephone Encounter (Signed)
I would recommend the patient trying Voltaren 75 mg 1 twice daily as needed to help with the discomfort may try it twice daily over the next 2 weeks #30  If patient was having true seizures it is not recommended to drive for 6 months and should do medical evaluation for seizures If he thinks he was just blacking out from the medication then stopping the medicine should solve the issue

## 2019-04-05 NOTE — Telephone Encounter (Signed)
Patient called to say that he was started on Gabapentin about a week ago & he thinks it's interacting with his Zoloft Has experienced seizure like activity 3 times after taking it  Please advise & call pt     [place]

## 2019-04-05 NOTE — Telephone Encounter (Signed)
Pt states that he is taking Gabapentin for nerve pain. 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 Pt states Gabapentin does have a moderate reaction with Zoloft. Pt has been in hospital once due to the convulsions. Pt has been on Gabapentin for about 2-3 weeks. Pt states that his chiropractor did xrays of his upper spine and neck area yesterday. Please advise. Thank you

## 2019-04-05 NOTE — Telephone Encounter (Signed)
Contacted patient. Pt wanting to know what else can be given for the nerve pain. Pt states it is hard to get through the day with the nerve pain. Informed patient to stop gabapentin and that it may be best for patient to do chiropractic to help with back. Pt also informed if he has further trouble that he would need a visit and/or evaluation for black outs. Pt verbalized understanding.

## 2019-04-05 NOTE — Telephone Encounter (Signed)
Please see previous phone message  Stop gabapentin  please make sure the patient is aware that if he has any further blacking out spells or seizure spells it would not be due to the gabapentin and he would need thorough evaluation for this

## 2019-04-05 NOTE — Telephone Encounter (Signed)
Pt contacted and verbalized understanding. Medication sent in to pharmacy °

## 2019-04-05 NOTE — Telephone Encounter (Signed)
I would recommend stopping gabapentin. For now may be best for the patient to do chiropractic to help with his back If he has any further trouble it would be best to do either visit or virtual visit

## 2019-04-29 ENCOUNTER — Telehealth: Payer: Self-pay | Admitting: Family Medicine

## 2019-04-29 MED ORDER — CYCLOBENZAPRINE HCL 10 MG PO TABS: 10.0000 mg | ORAL_TABLET | Freq: Three times a day (TID) | ORAL | 0 refills | Status: DC | PRN

## 2019-04-29 NOTE — Telephone Encounter (Signed)
Pt contacted office requesting Flexeril. Pt has a pinched nerve in arm. Pt states he has an MRI on Tuesday and his arm has been bothering him for a couple weeks. Pt states that he had some Flexeril previously prescribed and that seemed to help. Informed pt that Dr.Scott currently out of the office. Please advise. Thank you.

## 2019-04-29 NOTE — Telephone Encounter (Signed)
flexerio 10 numb 30 one tid prn spasm

## 2019-04-29 NOTE — Telephone Encounter (Signed)
Medication sent to pharmacy and pt is aware 

## 2019-04-29 NOTE — Addendum Note (Signed)
Addended by: Vicente Males on: 04/29/2019 03:29 PM   Modules accepted: Orders

## 2019-10-20 ENCOUNTER — Telehealth: Payer: Self-pay | Admitting: Family Medicine

## 2019-10-20 ENCOUNTER — Other Ambulatory Visit: Payer: Self-pay | Admitting: Family Medicine

## 2019-10-20 MED ORDER — HYDROCODONE-ACETAMINOPHEN 10-325 MG PO TABS: ORAL_TABLET | ORAL | 0 refills | Status: DC

## 2019-10-20 NOTE — Telephone Encounter (Signed)
Hydrocodone 10 mg was sent into the pharmacy may take as directed on the prescription use sparingly caution drowsiness

## 2019-10-20 NOTE — Telephone Encounter (Signed)
Pt states his dentist fixed a chipped tooth yesterday & today he's having pain in his jaw  He called the dentist but states that office is closed until Monday  Please advise & call pt     [employer]

## 2019-10-20 NOTE — Telephone Encounter (Signed)
Patient notified and verbalized understanding. 

## 2019-10-20 NOTE — Telephone Encounter (Signed)
Contacted patient. Pt states he is not sure if it is an infection because it is to soon. Pt states that he has tried Ibuprofen but it is not really helping. Pt would like to try a short course of pain med. Please advise. Thank you

## 2019-10-20 NOTE — Telephone Encounter (Signed)
Please check with patient Does he feel he is facing any type of infection related issues? I assume he is trying Tylenol or ibuprofen for pain? Does the patient feel he needs short prescription narcotic pain medicine to help?  Not meant for long-term use

## 2019-11-30 ENCOUNTER — Ambulatory Visit (INDEPENDENT_AMBULATORY_CARE_PROVIDER_SITE_OTHER): Payer: 59 | Admitting: Family Medicine

## 2019-11-30 DIAGNOSIS — M545 Low back pain, unspecified: Secondary | ICD-10-CM

## 2019-11-30 MED ORDER — HYDROCODONE-ACETAMINOPHEN 5-325 MG PO TABS: 1.0000 | ORAL_TABLET | Freq: Four times a day (QID) | ORAL | 0 refills | Status: AC | PRN

## 2019-11-30 MED ORDER — DICLOFENAC SODIUM 75 MG PO TBEC: DELAYED_RELEASE_TABLET | ORAL | 0 refills | Status: DC
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
## 2020-04-09 ENCOUNTER — Encounter: Payer: Self-pay | Admitting: Family Medicine

## 2020-04-09 ENCOUNTER — Telehealth: Payer: Self-pay | Admitting: Family Medicine

## 2020-04-09 ENCOUNTER — Other Ambulatory Visit: Payer: Self-pay

## 2020-04-09 ENCOUNTER — Telehealth (INDEPENDENT_AMBULATORY_CARE_PROVIDER_SITE_OTHER): Payer: 59 | Admitting: Family Medicine

## 2020-04-09 DIAGNOSIS — R197 Diarrhea, unspecified: Secondary | ICD-10-CM

## 2020-04-09 DIAGNOSIS — K219 Gastro-esophageal reflux disease without esophagitis: Secondary | ICD-10-CM | POA: Diagnosis not present

## 2020-04-09 MED ORDER — OMEPRAZOLE 20 MG PO CPDR: DELAYED_RELEASE_CAPSULE | ORAL | 3 refills | Status: DC

## 2020-04-09 NOTE — Telephone Encounter (Signed)
[name], waggle are scheduled for a virtual visit with your provider today.    Just as we do with appointments in the office, we must obtain your consent to participate.  Your consent will be active for this visit and any virtual visit you may have with one of our providers in the next 365 days.    If you have a MyChart account, I can also send a copy of this consent to you electronically.  All virtual visits are billed to your insurance company just like a traditional visit in the office.  As this is a virtual visit, video technology does not allow for your provider to perform a traditional examination.  This may limit your provider's ability to fully assess your condition.  If your provider identifies any concerns that need to be evaluated in person or the need to arrange testing such as labs, EKG, etc, we will make arrangements to do so.    Although advances in technology are sophisticated, we cannot ensure that it will always work on either your end or our end.  If the connection with a video visit is poor, we may have to switch to a telephone visit.  With either a video or telephone visit, we are not always able to ensure that we have a secure connection.   I need to obtain your verbal consent now.   Are you willing to proceed with your visit today?   Shane Butler has provided verbal consent on 04/09/2020 for a virtual visit (video or telephone).   Vicente Males, LPN [id number]  [id number] PM

## 2020-04-09 NOTE — Progress Notes (Signed)
Patient ID: Shane Butler, male    DOB: [birth year], 39 y.o.   MRN: [id number]   Chief Complaint  Patient presents with  . Diarrhea    Pt has been having weakness, sweats and diarrhea. Pt states that he becoming worn down from having diarrhea. Pt has not tried any treatments but has been trying to stay hydrated. Pt states that about one month ago he felt lactose intolerant because when he would eat ice cream or something of that nature, he would begin to have stomach issues. This has been going on off and on for about 5 days. Pt states he will also need a work note.    Virtual Visit via phone Note  I connected with Shane Butler on 04/09/20 at  1:40 PM EDT by a phone enabled telemedicine application and verified that I am speaking with the correct person using two identifiers.  Location: Patient: home Provider: office   I discussed the limitations of evaluation and management by telemedicine and the availability of in person appointments. The patient expressed understanding and agreed to proceed.     Subjective:    HPI Pt having diarrhea, sweats and fatigue.  Started about 5 days ago and worsened with diarrhea late last night.  Noticing it's dark almost black in color in the last 3 BMs.  Has had a lactose intolerance also and not sure if this is causing it.  Has h/o GERD and has been taking intermittently diclofenac and aleve.  Drinking lots of energy drinks with 6 caffeinated drinks per day.   No fever or bright blood in stool.  No sick contacts.  No pain in stomach at this time. Not taken meds for the diarrhea. Feels like the lactose intolerance started about 63monthago when eating ice cream.  Medical History JSakaihas a past medical history of Anxiety and Depression.   Outpatient Encounter Medications as of 04/09/2020  Medication Sig  . amphetamine-dextroamphetamine (ADDERALL) 20 MG tablet Take 20 mg by mouth 3 (three) times daily.  . cetirizine (ZYRTEC) 10 MG tablet Take  10 mg by mouth daily as needed for allergies or rhinitis.   . chlordiazePOXIDE (LIBRIUM) 25 MG capsule Take 50 mg by mouth at bedtime as needed.  . diclofenac (VOLTAREN) 75 MG EC tablet Take one tablet by mouth twice daily prn discomfort.  . naproxen sodium (ALEVE) 220 MG tablet Take 220 mg by mouth daily as needed (for pain).  .Marland Kitchensertraline (ZOLOFT) 100 MG tablet Take 100 mg by mouth daily.  .Marland Kitchenomeprazole (PRILOSEC) 20 MG capsule Take 1 tab p.o. 373ms before eating on empty stomach.   No facility-administered encounter medications on file as of 04/09/2020.     Review of Systems  Constitutional: Negative for chills and fever.  HENT: Negative for congestion, rhinorrhea and sore throat.   Respiratory: Negative for cough, shortness of breath and wheezing.   Cardiovascular: Negative for chest pain and leg swelling.  Gastrointestinal: Positive for diarrhea. Negative for abdominal pain, constipation, nausea and vomiting.  Genitourinary: Negative for dysuria and frequency.  Skin: Negative for rash.  Neurological: Negative for dizziness, weakness and headaches.     Vitals There were no vitals taken for this visit.  Objective:   Physical Exam  No PE due to phone visit.  Assessment and Plan   1. Diarrhea, unspecified type  2. Gastroesophageal reflux disease without esophagitis - omeprazole (PRILOSEC) 20 MG capsule; Take 1 tab p.o. 3030m before eating on empty stomach.  Dispense: 30  capsule; Refill: 3   - pt to increase water intake. - trial of imodium or pepto bismol for diarrhea.  If persisting with diarrhea, call for stool kit. -brat diet -GERD- take omeprazole and avoid caffeine, alcohol, acidic foods, NSAIDs. -call or rto if not improving.     [place], DO 04/09/2020   Follow Up Instructions:    I discussed the assessment and treatment plan with the patient. The patient was provided an opportunity to ask questions and all were answered. The patient agreed with the  plan and demonstrated an understanding of the instructions.   The patient was advised to call back or seek an in-person evaluation if the symptoms worsen or if the condition fails to improve as anticipated.  I provided 15 minutes of non-face-to-face time during this encounter.

## 2020-04-24 NOTE — Telephone Encounter (Signed)
Please sign encounter 

## 2020-05-28 ENCOUNTER — Encounter: Payer: Self-pay | Admitting: Family Medicine

## 2020-05-28 ENCOUNTER — Ambulatory Visit: Payer: 59 | Admitting: Family Medicine

## 2020-05-28 ENCOUNTER — Other Ambulatory Visit: Payer: Self-pay

## 2020-05-28 VITALS — BP 130/72 | Temp 97.7°F | Wt 113.6 lb

## 2020-05-28 DIAGNOSIS — R419 Unspecified symptoms and signs involving cognitive functions and awareness: Secondary | ICD-10-CM

## 2020-05-28 NOTE — Progress Notes (Signed)
° °  Subjective:    Patient ID: Shane Butler, male    DOB: [birth year], 39 y.o.   MRN: [id number]  HPI Pt here today for ER follow up. Pt went to ER at [facility] in [place] on 05/23/20. Pt went due to having "disassociation with body". Pt states this happen when he runs out of Xanax. Pt is on 2mg  Xanax BID. Prescribed by psychologist Dr.Kaur.  Patient presents follow-up from hospital Had some altered mental status related to being off of Xanax for a few days.  He states his specialist has been able to get him his medicine but he ran out a few days He states he has been on medicine for decades does not feel he can go down on the dose any further he states he does have medicine at home  Review of Systems  Constitutional: Negative for activity change, fatigue and fever.  HENT: Negative for congestion and rhinorrhea.   Respiratory: Negative for cough and shortness of breath.   Cardiovascular: Negative for chest pain and leg swelling.  Gastrointestinal: Negative for abdominal pain, diarrhea and nausea.  Genitourinary: Negative for dysuria and hematuria.  Neurological: Negative for weakness and headaches.  Psychiatric/Behavioral: Negative for agitation and behavioral problems.       Objective:   Physical Exam Vitals reviewed.  Cardiovascular:     Rate and Rhythm: Normal rate and regular rhythm.     Heart sounds: Normal heart sounds. No murmur heard.   Pulmonary:     Effort: Pulmonary effort is normal.     Breath sounds: Normal breath sounds.  Lymphadenopathy:     Cervical: No cervical adenopathy.  Neurological:     Mental Status: He is alert.  Psychiatric:        Behavior: Behavior normal.     Hospital records ordered await      Assessment & Plan:  Altered mental status Related to Xanax possible slight withdrawal I am encourage the patient to discuss with his psychiatrist the possibility of reducing his medicine further.  Ideally to get it down to 1 mg twice a day or less.  He  will follow-up with psychiatry.  No problems on today's exam

## 2020-11-15 ENCOUNTER — Other Ambulatory Visit: Payer: Self-pay

## 2020-11-15 ENCOUNTER — Encounter: Payer: Self-pay | Admitting: Family Medicine

## 2020-11-15 ENCOUNTER — Telehealth (INDEPENDENT_AMBULATORY_CARE_PROVIDER_SITE_OTHER): Payer: Self-pay | Admitting: Family Medicine

## 2020-11-15 DIAGNOSIS — R059 Cough, unspecified: Secondary | ICD-10-CM

## 2020-11-15 MED ORDER — BENZONATATE 100 MG PO CAPS: 100.0000 mg | ORAL_CAPSULE | Freq: Two times a day (BID) | ORAL | 0 refills | Status: DC | PRN

## 2020-11-15 MED ORDER — ALBUTEROL SULFATE HFA 108 (90 BASE) MCG/ACT IN AERS: 2.0000 | INHALATION_SPRAY | Freq: Four times a day (QID) | RESPIRATORY_TRACT | 0 refills | Status: DC | PRN

## 2020-11-15 MED ORDER — GUAIFENESIN-CODEINE 100-10 MG/5ML PO SOLN
5.0000 mL | Freq: Four times a day (QID) | ORAL | 0 refills | Status: DC | PRN
Start: 2020-11-15 — End: 2023-03-24

## 2020-11-15 NOTE — Progress Notes (Signed)
Patient ID: Shane Butler, male    DOB: 06/11/1981, 39 y.o.   MRN: [id number]   Chief Complaint  Patient presents with  . Cough   Subjective:  CC: cough, feels like bronchitis  This is a new problem.  Presents today as a telephone visit with a complaint of cough "feels like bronchitis "symptoms started 2 days ago, has tried Delsym.  Associated symptoms include fatigue, postnasal drip, cough with a deep breath.  Pertinent negatives include fever, chills, shortness of breath.  Coughing has been keeping him up at night.  His last Covid test which was negative was 1 week ago.  He has had his Covid vaccine booster, approximately 3 weeks ago.  Cough This is a new problem. Episode onset: 2 days  The cough is productive of sputum. Associated symptoms include postnasal drip. Pertinent negatives include no chills, ear pain, fever or shortness of breath. Associated symptoms comments: Fatigue. Waking up at night coughing. . Treatments tried: delsum.    Virtual Visit via Video Note  I connected with Shane Butler on 11/15/20 at  1:30 PM EST by a video enabled telemedicine application and verified that I am speaking with the correct person using two identifiers.  Location: Patient: home Provider: office    I discussed the limitations of evaluation and management by telemedicine and the availability of in person appointments. The patient expressed understanding and agreed to proceed.  History of Present Illness:    Observations/Objective:   Assessment and Plan:   Follow Up Instructions:    I discussed the assessment and treatment plan with the patient. The patient was provided an opportunity to ask questions and all were answered. The patient agreed with the plan and demonstrated an understanding of the instructions.   The patient was advised to call back or seek an in-person evaluation if the symptoms worsen or if the condition fails to improve as anticipated.  I provided 8 minutes  of non-face-to-face time during this encounter.  Medical History Shane Butler has a past medical history of Anxiety and Depression.   Outpatient Encounter Medications as of 11/15/2020  Medication Sig  . albuterol (VENTOLIN HFA) 108 (90 Base) MCG/ACT inhaler Inhale 2 puffs into the lungs every 6 (six) hours as needed for wheezing or shortness of breath.  . amphetamine-dextroamphetamine (ADDERALL) 20 MG tablet Take 20 mg by mouth 3 (three) times daily.  . benzonatate (TESSALON) 100 MG capsule Take 1 capsule (100 mg total) by mouth 2 (two) times daily as needed for cough.  . chlordiazePOXIDE (LIBRIUM) 25 MG capsule Take 50 mg by mouth at bedtime as needed.  . diclofenac (VOLTAREN) 75 MG EC tablet Take one tablet by mouth twice daily prn discomfort.  Marland Kitchen guaiFENesin-codeine 100-10 MG/5ML syrup Take 5 mLs by mouth every 6 (six) hours as needed for cough.  . naproxen sodium (ALEVE) 220 MG tablet Take 220 mg by mouth daily as needed (for pain).  Marland Kitchen omeprazole (PRILOSEC) 20 MG capsule Take 1 tab p.o. 23mins before eating on empty stomach.  . sertraline (ZOLOFT) 100 MG tablet Take 100 mg by mouth daily.   No facility-administered encounter medications on file as of 11/15/2020.     Review of Systems  Constitutional: Positive for fatigue. Negative for chills and fever.  HENT: Positive for postnasal drip. Negative for ear pain.   Respiratory: Positive for cough. Negative for shortness of breath.      Vitals There were no vitals taken for this visit.  Objective:   Physical  Exam  Unable due to telelphone visit Assessment and Plan   1. Cough in adult patient - albuterol (VENTOLIN HFA) 108 (90 Base) MCG/ACT inhaler; Inhale 2 puffs into the lungs every 6 (six) hours as needed for wheezing or shortness of breath.  Dispense: 8 g; Refill: 0 - benzonatate (TESSALON) 100 MG capsule; Take 1 capsule (100 mg total) by mouth 2 (two) times daily as needed for cough.  Dispense: 20 capsule; Refill: 0 -  guaiFENesin-codeine 100-10 MG/5ML syrup; Take 5 mLs by mouth every 6 (six) hours as needed for cough.  Dispense: 120 mL; Refill: 0   Due to the complaint of cough, will send for albuterol inhaler, cough medicine for daytime use, and cough medicine for night time use.  No reports of fever, or shortness of breath, he was able to converse with me with no difficulty during her interview on the phone today.  This is likely viral in nature, and the cough must run its course.  Agrees with plan of care discussed today. Understands warning signs to seek further care: Chest pain, shortness of breath, any significant change in health. Understands to follow-up if symptoms do not improve, or worsen.  Pecolia Ades, FNP-C

## 2022-03-20 DIAGNOSIS — F331 Major depressive disorder, recurrent, moderate: Secondary | ICD-10-CM | POA: Diagnosis not present

## 2022-03-20 DIAGNOSIS — F410 Panic disorder [episodic paroxysmal anxiety] without agoraphobia: Secondary | ICD-10-CM | POA: Diagnosis not present

## 2022-03-20 DIAGNOSIS — F900 Attention-deficit hyperactivity disorder, predominantly inattentive type: Secondary | ICD-10-CM | POA: Diagnosis not present

## 2022-07-02 DIAGNOSIS — M9902 Segmental and somatic dysfunction of thoracic region: Secondary | ICD-10-CM | POA: Diagnosis not present

## 2022-07-02 DIAGNOSIS — M546 Pain in thoracic spine: Secondary | ICD-10-CM | POA: Diagnosis not present

## 2022-07-02 DIAGNOSIS — M9903 Segmental and somatic dysfunction of lumbar region: Secondary | ICD-10-CM | POA: Diagnosis not present

## 2022-07-02 DIAGNOSIS — M62830 Muscle spasm of back: Secondary | ICD-10-CM | POA: Diagnosis not present

## 2022-07-20 ENCOUNTER — Other Ambulatory Visit: Payer: Self-pay

## 2022-07-20 ENCOUNTER — Encounter: Payer: Self-pay | Admitting: Emergency Medicine

## 2022-07-20 ENCOUNTER — Telehealth: Payer: Self-pay | Admitting: Emergency Medicine

## 2022-07-20 ENCOUNTER — Ambulatory Visit
Admission: EM | Admit: 2022-07-20 | Discharge: 2022-07-20 | Disposition: A | Discharge status: Home / Self Care | Payer: BC Managed Care – PPO | Attending: Nurse Practitioner | Admitting: Nurse Practitioner

## 2022-07-20 DIAGNOSIS — M545 Low back pain, unspecified: Secondary | ICD-10-CM | POA: Diagnosis not present

## 2022-07-20 MED ORDER — CYCLOBENZAPRINE HCL 10 MG PO TABS: 10.0000 mg | ORAL_TABLET | Freq: Every evening | ORAL | 0 refills | Status: DC | PRN
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
## 2022-07-20 NOTE — Discharge Instructions (Addendum)
-   Back pain is likely musculoskeletal in nature -Continue Advil every 8 hours as needed, make sure you a eat with this medication to prevent GI upset -You can use cyclobenzaprine at nighttime as needed for muscle pain -Continue ice/heat as needed for pain -Follow-up with primary care provider if pain persists or worsens despite treatment

## 2022-07-20 NOTE — Telephone Encounter (Signed)
Pt returned to [facility] and reported pharmacy was closed when went to pick up medication. Pt would like px sent to [facility] on [address]. Px electronically sent.
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
## 2022-09-11 DIAGNOSIS — F900 Attention-deficit hyperactivity disorder, predominantly inattentive type: Secondary | ICD-10-CM | POA: Diagnosis not present

## 2022-09-11 DIAGNOSIS — F3342 Major depressive disorder, recurrent, in full remission: Secondary | ICD-10-CM | POA: Diagnosis not present

## 2022-09-11 DIAGNOSIS — F410 Panic disorder [episodic paroxysmal anxiety] without agoraphobia: Secondary | ICD-10-CM | POA: Diagnosis not present

## 2023-02-26 DIAGNOSIS — F900 Attention-deficit hyperactivity disorder, predominantly inattentive type: Secondary | ICD-10-CM | POA: Diagnosis not present

## 2023-02-26 DIAGNOSIS — F410 Panic disorder [episodic paroxysmal anxiety] without agoraphobia: Secondary | ICD-10-CM | POA: Diagnosis not present

## 2023-03-24 ENCOUNTER — Ambulatory Visit
Admission: EM | Admit: 2023-03-24 | Discharge: 2023-03-24 | Disposition: A | Discharge status: Home / Self Care | Payer: BC Managed Care – PPO | Attending: Family Medicine | Admitting: Family Medicine

## 2023-03-24 DIAGNOSIS — R051 Acute cough: Secondary | ICD-10-CM

## 2023-03-24 DIAGNOSIS — R062 Wheezing: Secondary | ICD-10-CM

## 2023-03-24 MED ORDER — PREDNISONE 20 MG PO TABS
40.0000 mg | ORAL_TABLET | Freq: Once | ORAL | Status: AC
  Administered 2023-03-24: 40 mg via ORAL

## 2023-03-24 MED ORDER — CETIRIZINE HCL 10 MG PO TABS: 10.0000 mg | ORAL_TABLET | Freq: Every day | ORAL | 0 refills | Status: DC

## 2023-03-24 MED ORDER — PREDNISONE 20 MG PO TABS: 40.0000 mg | ORAL_TABLET | Freq: Every day | ORAL | 0 refills | Status: DC

## 2023-03-24 MED ORDER — FLUTICASONE PROPIONATE 50 MCG/ACT NA SUSP
1.0000 | Freq: Every day | NASAL | 0 refills | Status: DC
Start: 2023-03-24 — End: 2023-11-03

## 2023-03-24 NOTE — ED Triage Notes (Signed)
Pt reports he has mucus drainage in the back of his throat and a cough x 3 days. Took benadryl and delsyum.

## 2023-03-24 NOTE — ED Provider Notes (Signed)
[facility]   [id number] 03/24/23 Arrival Time: 1814  ASSESSMENT & PLAN:  1. Acute cough   2. Wheezing    No current resp distress. Discussed viral vs allergy trigger. OTC symptom care as needed.  New Prescriptions   CETIRIZINE (ZYRTEC ALLERGY) 10 MG TABLET    Take 1 tablet (10 mg total) by mouth daily.   FLUTICASONE (FLONASE) 50 MCG/ACT NASAL SPRAY    Place 1 spray into both nostrils daily.   PREDNISONE (DELTASONE) 20 MG TABLET    Take 2 tablets (40 mg total) by mouth daily.     Follow-up Information     Luking, Jonna Coup, MD.   Specialty: Family Medicine Why: If worsening or failing to improve as anticipated. Contact information: [address] [place] [address] [phone]                 Reviewed expectations re: course of current medical issues. Questions answered. Outlined signs and symptoms indicating need for more acute intervention. Understanding verbalized. After Visit Summary given.   SUBJECTIVE: History from: Patient. Shane Butler is a 42 y.o. male. Pt reports he has mucus drainage in the back of his throat and a cough x 3 days. Took benadryl and delsyum. Wheezing at times. Feels like current symptoms are allergy related. Denies: fever. Normal PO intake without n/v/d. Social History   Tobacco Use  Smoking Status Every Day   Packs/day: .25   Types: Cigarettes  Smokeless Tobacco Never   OBJECTIVE:  Vitals:   03/24/23 1837  BP: 120/85  Pulse: 60  Resp: 20  Temp: 98.5 F (36.9 C)  TempSrc: Oral  SpO2: 96%    General appearance: alert; no distress Eyes: PERRLA; EOMI; conjunctiva normal HENT: [place]; AT; without nasal congestion Neck: supple  Lungs: speaks full sentences without difficulty; unlabored; mild bilat exp wheezing Extremities: no edema Skin: warm and dry Neurologic: normal gait Psychological: alert and cooperative; normal mood and affect  Allergies  Allergen Reactions   Gabapentin     Patient  states that it made him blackout have a seizure   Ciprofloxacin Hives   Other     Antibiotic (patient does not know name of it)    Past Medical History:  Diagnosis Date   Anxiety    Depression    Social History   Socioeconomic History   Marital status: Single    Spouse name: Not on file   Number of children: Not on file   Years of education: Not on file   Highest education level: Not on file  Occupational History   Not on file  Tobacco Use   Smoking status: Every Day    Packs/day: .25    Types: Cigarettes   Smokeless tobacco: Never  Vaping Use   Vaping Use: Never used  Substance and Sexual Activity   Alcohol use: Yes    Alcohol/week: 28.0 standard drinks of alcohol    Types: 28 Cans of beer per week    Comment: drinks 4 cans of beer each night   Drug use: Not Currently   Sexual activity: Not on file  Other Topics Concern   Not on file  Social History Narrative   Not on file   Social Determinants of Health   Financial Resource Strain: Not on file  Food Insecurity: Not on file  Transportation Needs: Not on file  Physical Activity: Not on file  Stress: Not on file  Social Connections: Not on file  Intimate Partner Violence:  Not on file   History reviewed. No pertinent family history. Past Surgical History:  Procedure Laterality Date   WISDOM TOOTH EXTRACTION       Mardella Layman, MD 03/24/23 1859

## 2023-08-26 DIAGNOSIS — F900 Attention-deficit hyperactivity disorder, predominantly inattentive type: Secondary | ICD-10-CM | POA: Diagnosis not present

## 2023-08-26 DIAGNOSIS — F3342 Major depressive disorder, recurrent, in full remission: Secondary | ICD-10-CM | POA: Diagnosis not present

## 2023-08-26 DIAGNOSIS — F410 Panic disorder [episodic paroxysmal anxiety] without agoraphobia: Secondary | ICD-10-CM | POA: Diagnosis not present

## 2023-08-30 ENCOUNTER — Emergency Department (HOSPITAL_COMMUNITY): Payer: BC Managed Care – PPO

## 2023-08-30 ENCOUNTER — Emergency Department (HOSPITAL_COMMUNITY)
Admission: EM | Admit: 2023-08-30 | Discharge: 2023-08-30 | Disposition: A | Discharge status: Home / Self Care | Payer: BC Managed Care – PPO | Attending: Emergency Medicine | Admitting: Emergency Medicine

## 2023-08-30 ENCOUNTER — Encounter (HOSPITAL_COMMUNITY): Payer: Self-pay

## 2023-08-30 ENCOUNTER — Other Ambulatory Visit: Payer: Self-pay

## 2023-08-30 DIAGNOSIS — I10 Essential (primary) hypertension: Secondary | ICD-10-CM | POA: Diagnosis not present

## 2023-08-30 DIAGNOSIS — F32A Depression, unspecified: Secondary | ICD-10-CM | POA: Diagnosis not present

## 2023-08-30 DIAGNOSIS — R000 Tachycardia, unspecified: Secondary | ICD-10-CM | POA: Diagnosis not present

## 2023-08-30 DIAGNOSIS — R55 Syncope and collapse: Secondary | ICD-10-CM | POA: Diagnosis not present

## 2023-08-30 DIAGNOSIS — R9431 Abnormal electrocardiogram [ECG] [EKG]: Secondary | ICD-10-CM | POA: Diagnosis not present

## 2023-08-30 DIAGNOSIS — G40909 Epilepsy, unspecified, not intractable, without status epilepticus: Secondary | ICD-10-CM | POA: Diagnosis not present

## 2023-08-30 DIAGNOSIS — R569 Unspecified convulsions: Secondary | ICD-10-CM | POA: Insufficient documentation

## 2023-08-30 LAB — CBC WITH DIFFERENTIAL/PLATELET
Abs Immature Granulocytes: 0.02 10*3/uL (ref 0.00–0.07)
Basophils Absolute: 0 10*3/uL (ref 0.0–0.1)
Basophils Relative: 1 %
Eosinophils Absolute: 0 10*3/uL (ref 0.0–0.5)
Eosinophils Relative: 0 %
HCT: 41.4 % (ref 39.0–52.0)
Hemoglobin: 14.5 g/dL (ref 13.0–17.0)
Immature Granulocytes: 0 %
Lymphocytes Relative: 19 %
Lymphs Abs: 1.3 10*3/uL (ref 0.7–4.0)
MCH: 33 pg (ref 26.0–34.0)
MCHC: 35 g/dL (ref 30.0–36.0)
MCV: 94.1 fL (ref 80.0–100.0)
Monocytes Absolute: 0.4 10*3/uL (ref 0.1–1.0)
Monocytes Relative: 7 %
Neutro Abs: 4.9 10*3/uL (ref 1.7–7.7)
Neutrophils Relative %: 73 %
Platelets: 181 10*3/uL (ref 150–400)
RBC: 4.4 MIL/uL (ref 4.22–5.81)
RDW: 13 % (ref 11.5–15.5)
WBC: 6.7 10*3/uL (ref 4.0–10.5)
nRBC: 0 % (ref 0.0–0.2)

## 2023-08-30 LAB — COMPREHENSIVE METABOLIC PANEL
ALT: 23 U/L (ref 0–44)
AST: 20 U/L (ref 15–41)
Albumin: 3.9 g/dL (ref 3.5–5.0)
Alkaline Phosphatase: 63 U/L (ref 38–126)
Anion gap: 8 (ref 5–15)
BUN: 13 mg/dL (ref 6–20)
CO2: 26 mmol/L (ref 22–32)
Calcium: 8.8 mg/dL — ABNORMAL LOW (ref 8.9–10.3)
Chloride: 100 mmol/L (ref 98–111)
Creatinine, Ser: 0.71 mg/dL (ref 0.61–1.24)
GFR, Estimated: >60 mL/min (ref >60)
Glucose, Bld: 97 mg/dL (ref 70–99)
Potassium: 3.8 mmol/L (ref 3.5–5.1)
Sodium: 134 mmol/L — ABNORMAL LOW (ref 135–145)
Total Bilirubin: 0.7 mg/dL (ref 0.3–1.2)
Total Protein: 6.3 g/dL — ABNORMAL LOW (ref 6.5–8.1)

## 2023-08-30 LAB — CBG MONITORING, ED: Glucose-Capillary: 112 mg/dL — ABNORMAL HIGH (ref 70–99)

## 2023-08-30 NOTE — ED Triage Notes (Signed)
BIB [facility] for "seizures." Per pt "I felt myself going down and started crawling to a safe space." Pt reports to EMS having a drug dependency on cocaine and last used today. Pt states he had a seizure yesterday as well. Pt states he is on medications for seizures.

## 2023-08-30 NOTE — ED Notes (Signed)
Patient transported to CT 

## 2023-08-30 NOTE — ED Provider Notes (Signed)
[place] [facility] AT [facility] Provider Note   CSN: [id number] Arrival date & time: 08/30/23  1551     History  Chief Complaint  Patient presents with   Seizures    Shane Butler is a 42 y.o. male with a past medical history of anxiety, depression and self-reported seizures who presents today for "seizures".  Patient says he did cocaine and immediately felt like he was going to have a seizure so he moved to a safe place.  He states bystanders said he had seizure-like activity.  He denies any incontinence, tongue bite, postictal state.  He says his psychiatrist prescribed him oxcarbazepine for seizures that he is supposed to take twice a day but he only takes once a day due to cost.   Seizures      Home Medications Prior to Admission medications   Medication Sig Start Date End Date Taking? Authorizing Provider  albuterol (VENTOLIN HFA) 108 (90 Base) MCG/ACT inhaler Inhale 2 puffs into the lungs every 6 (six) hours as needed for wheezing or shortness of breath. 11/15/20   Novella Olive, FNP  alprazolam Prudy Feeler) 2 MG tablet Take 2 mg by mouth 2 (two) times daily as needed. 03/12/23   [provider]  amphetamine-dextroamphetamine (ADDERALL) 20 MG tablet Take 20 mg by mouth 3 (three) times daily. 03/21/20   [provider]  cetirizine (ZYRTEC ALLERGY) 10 MG tablet Take 1 tablet (10 mg total) by mouth daily. 03/24/23   Mardella Layman, MD  desvenlafaxine (PRISTIQ) 100 MG 24 hr tablet Take 100 mg by mouth daily. 02/25/23   [provider]  fluticasone (FLONASE) 50 MCG/ACT nasal spray Place 1 spray into both nostrils daily. 03/24/23   Mardella Layman, MD  naproxen sodium (ALEVE) 220 MG tablet Take 220 mg by mouth daily as needed (for pain).    [provider]  Oxcarbazepine (TRILEPTAL) 300 MG tablet Take 300 mg by mouth 2 (two) times daily. 02/25/23   [provider]  predniSONE (DELTASONE) 20 MG tablet Take 2 tablets (40 mg  total) by mouth daily. 03/24/23   Mardella Layman, MD      Allergies    Gabapentin, Ciprofloxacin, and Other    Review of Systems   Review of Systems  Constitutional:  Negative for fever.  HENT:  Negative for hearing loss.   Respiratory:  Negative for chest tightness and shortness of breath.   Gastrointestinal:  Negative for diarrhea, nausea and vomiting.  Neurological:  Positive for seizures. Negative for weakness and headaches.    Physical Exam Updated Vital Signs BP 123/83   Pulse 63   Temp 98.4 F (36.9 C) (Oral)   Resp 11   Ht 5\' 10"  (1.778 m)   Wt 51.5 kg   SpO2 97%   BMI 16.29 kg/m  Physical Exam Vitals and nursing note reviewed.  Constitutional:      General: He is not in acute distress.    Appearance: He is well-developed.  HENT:     Head: Normocephalic and atraumatic.     Mouth/Throat:     Mouth: Mucous membranes are moist.     Pharynx: No oropharyngeal exudate.  Eyes:     Conjunctiva/sclera: Conjunctivae normal.  Cardiovascular:     Rate and Rhythm: Normal rate and regular rhythm.     Heart sounds: No murmur heard. Pulmonary:     Effort: Pulmonary effort is normal. No respiratory distress.     Breath sounds: Normal breath sounds.  Abdominal:  Palpations: Abdomen is soft.     Tenderness: There is no abdominal tenderness.  Musculoskeletal:        General: No swelling.     Cervical back: Neck supple.  Skin:    General: Skin is warm and dry.  Neurological:     General: No focal deficit present.     Mental Status: He is alert and oriented to person, place, and time.     Cranial Nerves: No cranial nerve deficit.     Motor: No weakness.  Psychiatric:        Mood and Affect: Mood normal.     ED Results / Procedures / Treatments   Labs (all labs ordered are listed, but only abnormal results are displayed) Labs Reviewed  COMPREHENSIVE METABOLIC PANEL - Abnormal; Notable for the following components:      Result Value   Sodium 134 (*)    Calcium  8.8 (*)    Total Protein 6.3 (*)    All other components within normal limits  CBG MONITORING, ED - Abnormal; Notable for the following components:   Glucose-Capillary 112 (*)    All other components within normal limits  CBC WITH DIFFERENTIAL/PLATELET    EKG EKG Interpretation Date/Time:  Sunday August 30 2023 17:08:01 EDT Ventricular Rate:  67 PR Interval:  160 QRS Duration:  91 QT Interval:  415 QTC Calculation: 439 R Axis:   84  Text Interpretation: Sinus rhythm Probable left atrial enlargement Nonspecific T abnrm, anterolateral leads Confirmed by Eber Hong ([id number]) on 08/30/2023 5:12:04 PM  Radiology CT Head Wo Contrast  Result Date: 08/30/2023 CLINICAL DATA:  Syncope EXAM: CT HEAD WITHOUT CONTRAST TECHNIQUE: Contiguous axial images were obtained from the base of the skull through the vertex without intravenous contrast. RADIATION DOSE REDUCTION: This exam was performed according to the departmental dose-optimization program which includes automated exposure control, adjustment of the mA and/or kV according to patient size and/or use of iterative reconstruction technique. COMPARISON:  CT and MRI 04/22/2005 FINDINGS: Brain: No acute territorial infarction, hemorrhage or intracranial mass. The ventricles are nonenlarged. Vascular: No hyperdense vessel or unexpected calcification. Skull: Normal. Negative for fracture or focal lesion. Sinuses/Orbits: No acute finding. Other: None IMPRESSION: Negative non contrasted CT appearance of the brain. Electronically Signed   By: Jasmine Pang M.D.   On: 08/30/2023 17:01    Procedures Procedures    Medications Ordered in ED Medications - No data to display  ED Course/ Medical Decision Making/ A&P                                 Medical Decision Making Amount and/or Complexity of Data Reviewed Labs: ordered. Radiology: ordered.  This patient presents to the ED with chief complaint(s) of seizure-like activity with pertinent past  medical history of cocaine abuse, anxiety, depression which further complicates the presenting complaint. The complaint involves an extensive differential diagnosis and also carries with it a high risk of complications and morbidity.    The differential diagnosis includes seizure, syncope, hypoglycemia, substance abuse  Additional history obtained: Records reviewed Primary Care Documents family med office visits  ED Course and Reassessment:  EKG with nonspecific T wave abnormality could be attributed to cocaine use.  Independent labs interpretation:  The following labs were independently interpreted: Blood glucose of 112.  CBC and CMP with no remarkable findings.   Independent visualization of imaging: - I independently visualized the following imaging with scope  of interpretation limited to determining acute life threatening conditions related to emergency care: CT head without contrast, which revealed no acute territorial infarction, hemorrhage, or intracranial mass  Consultation: - Consulted or discussed management/test interpretation w/ external professional: None  Consideration for admission or further workup: The patient need for follow-up with neurology to rule out seizure versus syncope.  Patient expressed understanding.  Patient has remained stable throughout ER visit was previously slightly hypertensive but is now normotensive.  No seizure-like activity while in emergency department.         Final Clinical Impression(s) / ED Diagnoses Final diagnoses:  Seizure-like activity [facility])    Rx / DC Orders ED Discharge Orders     None         Gretta Began 08/30/23 Gayla Doss, MD 08/31/23 [phone]

## 2023-08-30 NOTE — Discharge Instructions (Signed)
You were treated for seizure-like activity.  Please schedule an appointment with neurology as soon as possible for further evaluation. Thank you for letting us treat you today. After reviewing your labs and imaging, I feel you are safe to go home. Please follow up with your PCP in the next several days and provide them with your records from this visit. Return to the Emergency Room if pain becomes severe or symptoms worsen.

## 2023-09-03 ENCOUNTER — Ambulatory Visit: Payer: BC Managed Care – PPO | Admitting: Family Medicine

## 2023-09-03 VITALS — BP 113/75 | HR 72 | Temp 97.9°F | Ht 70.0 in | Wt 119.6 lb

## 2023-09-03 DIAGNOSIS — R4182 Altered mental status, unspecified: Secondary | ICD-10-CM

## 2023-09-03 NOTE — Progress Notes (Signed)
Subjective:    Patient ID: Shane Butler, male    DOB: 1981/03/10, 42 y.o.   MRN: [id number]  HPI  Patient with a very difficult situation He has had at least 2 different spells where he seemed to get disoriented and did not seem to be responding the way he should there is been no obvious seizure activity he is not biting his lip no defecation no urination on himself he believes that some of the small cigarettes he was smoking had been contaminated with K2 or possibly something else.  He denies drug use currently.  He states he does see a psychiatrist and he relates that she has had him on medication to help treat depression anxiety but also prevent seizures.  He has had a couple seizures in the past when he stopped taking his Xanax abruptly.  Review of Systems     Objective:   Physical Exam General-in no acute distress Eyes-no discharge Lungs-respiratory rate normal, CTA CV-no murmurs,RRR Extremities skin warm dry no edema Neuro grossly normal Behavior normal, alert        Assessment & Plan:   He is currently normal-appearing Very nice guy Works at [employer] These spells could well be inadvertent drug exposure But could also be related to absence seizure's or something else going on It would be reasonable to see neurology perhaps they would consider EEG and give further opinion on whether or not he is having any seizures For now I would recommend for him to stick with his medicines and not miss doses Also encouraged him to stay away from any type of illicit drugs He is staying away from alcohol As for his Xanax he states he is doing 1 mg twice daily and I told him do not abruptly stop that He should work closely with his psychiatrist in regards to his psychiatric meds  Should he feel he is having any of these altered darkness spells he needs to stop what he is doing immediately and lay down and he should notify us if any ongoing troubles he should not drive with  any of these spells

## 2023-10-21 ENCOUNTER — Encounter: Payer: BC Managed Care – PPO | Admitting: Family Medicine

## 2023-11-03 ENCOUNTER — Encounter: Payer: Self-pay | Admitting: Family Medicine

## 2023-11-03 ENCOUNTER — Ambulatory Visit (INDEPENDENT_AMBULATORY_CARE_PROVIDER_SITE_OTHER): Payer: BC Managed Care – PPO | Admitting: Neurology

## 2023-11-03 ENCOUNTER — Ambulatory Visit (INDEPENDENT_AMBULATORY_CARE_PROVIDER_SITE_OTHER): Payer: BC Managed Care – PPO | Admitting: Family Medicine

## 2023-11-03 ENCOUNTER — Encounter: Payer: Self-pay | Admitting: Neurology

## 2023-11-03 VITALS — BP 140/92 | HR 67 | Ht 71.0 in | Wt 117.0 lb

## 2023-11-03 VITALS — BP 118/79 | HR 75 | Ht 70.0 in | Wt 116.0 lb

## 2023-11-03 DIAGNOSIS — Z5181 Encounter for therapeutic drug level monitoring: Secondary | ICD-10-CM

## 2023-11-03 DIAGNOSIS — Z13220 Encounter for screening for lipoid disorders: Secondary | ICD-10-CM | POA: Diagnosis not present

## 2023-11-03 DIAGNOSIS — Z0000 Encounter for general adult medical examination without abnormal findings: Secondary | ICD-10-CM | POA: Diagnosis not present

## 2023-11-03 DIAGNOSIS — R799 Abnormal finding of blood chemistry, unspecified: Secondary | ICD-10-CM | POA: Diagnosis not present

## 2023-11-03 DIAGNOSIS — Z1159 Encounter for screening for other viral diseases: Secondary | ICD-10-CM

## 2023-11-03 DIAGNOSIS — R569 Unspecified convulsions: Secondary | ICD-10-CM | POA: Diagnosis not present

## 2023-11-03 DIAGNOSIS — Z114 Encounter for screening for human immunodeficiency virus [HIV]: Secondary | ICD-10-CM

## 2023-11-03 DIAGNOSIS — E559 Vitamin D deficiency, unspecified: Secondary | ICD-10-CM | POA: Diagnosis not present

## 2023-11-03 NOTE — Patient Instructions (Signed)
Continue with oxcarbazepine 300 mg twice daily Will check oxcarbazepine level with vitamin D Continue your other medications Continue follow-up with your doctors Return in 1 year or sooner if worse.

## 2023-11-03 NOTE — Addendum Note (Signed)
Addended by: Lilyan Punt A on: 11/03/2023 12:29 PM   Modules accepted: Orders

## 2023-11-03 NOTE — Progress Notes (Signed)
   Subjective:    Patient ID: Shane Butler, male    DOB: [birth year], 42 y.o.   MRN: [id number]  HPI The patient comes in today for a wellness visit.  He does smoke but a pack will last him a week and a half He has been encouraged to quit He tries to eat healthy He has always been thin weight Follows with psychiatry for his meds Not suicidal   A review of their health history was completed.  A review of medications was also completed.  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   Additional concerns: none    Review of Systems     Objective:   Physical Exam  General-in no acute distress Eyes-no discharge Lungs-respiratory rate normal, CTA CV-no murmurs,RRR Extremities skin warm dry no edema Neuro grossly normal Behavior normal, alert       Assessment & Plan:  Intermittent allergies uses allergy medicines as needed during seasons Adult wellness-complete.wellness physical was conducted today. Importance of diet and exercise were discussed in detail.  Importance of stress reduction and healthy living were discussed.  In addition to this a discussion regarding safety was also covered.  We also reviewed over immunizations and gave recommendations regarding current immunization needed for age.   In addition to this additional areas were also touched on including: Preventative health exams needed:  Colonoscopy not indicated currently Due to family history of prostate cancer start PSAs at age 69  Patient was advised yearly wellness exam  Lab work ordered
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
## 2023-11-04 DIAGNOSIS — Z1159 Encounter for screening for other viral diseases: Secondary | ICD-10-CM | POA: Diagnosis not present

## 2023-11-04 DIAGNOSIS — Z114 Encounter for screening for human immunodeficiency virus [HIV]: Secondary | ICD-10-CM | POA: Diagnosis not present

## 2023-11-04 DIAGNOSIS — Z13220 Encounter for screening for lipoid disorders: Secondary | ICD-10-CM | POA: Diagnosis not present

## 2023-11-05 LAB — LIPID PANEL
Chol/HDL Ratio: 2.7 {ratio} (ref 0.0–5.0)
Cholesterol, Total: 174 mg/dL (ref 100–199)
HDL: 65 mg/dL (ref >39)
LDL Chol Calc (NIH): 92 mg/dL (ref 0–99)
Triglycerides: 91 mg/dL (ref 0–149)
VLDL Cholesterol Cal: 17 mg/dL (ref 5–40)

## 2023-11-05 LAB — HIV ANTIBODY (ROUTINE TESTING W REFLEX): HIV Screen 4th Generation wRfx: NONREACTIVE

## 2023-11-05 LAB — HEPATITIS C ANTIBODY: Hep C Virus Ab: NONREACTIVE

## 2023-11-06 LAB — 10-HYDROXYCARBAZEPINE: Oxcarbazepine SerPl-Mcnc: 8 ug/mL — ABNORMAL LOW (ref 10–35)

## 2023-11-06 LAB — VITAMIN D 25 HYDROXY (VIT D DEFICIENCY, FRACTURES): Vit D, 25-Hydroxy: 35.5 ng/mL (ref 30.0–100.0)

## 2023-11-16 ENCOUNTER — Other Ambulatory Visit: Payer: Self-pay

## 2023-11-16 ENCOUNTER — Emergency Department (HOSPITAL_COMMUNITY)
Admission: EM | Admit: 2023-11-16 | Discharge: 2023-11-16 | Disposition: A | Discharge status: Home / Self Care | Payer: BC Managed Care – PPO | Attending: Emergency Medicine | Admitting: Emergency Medicine

## 2023-11-16 ENCOUNTER — Encounter (HOSPITAL_COMMUNITY): Payer: Self-pay | Admitting: Emergency Medicine

## 2023-11-16 ENCOUNTER — Emergency Department (HOSPITAL_COMMUNITY): Payer: BC Managed Care – PPO

## 2023-11-16 DIAGNOSIS — R569 Unspecified convulsions: Secondary | ICD-10-CM | POA: Insufficient documentation

## 2023-11-16 DIAGNOSIS — R9431 Abnormal electrocardiogram [ECG] [EKG]: Secondary | ICD-10-CM | POA: Diagnosis not present

## 2023-11-16 DIAGNOSIS — I10 Essential (primary) hypertension: Secondary | ICD-10-CM | POA: Diagnosis not present

## 2023-11-16 HISTORY — DX: Unspecified convulsions: R56.9

## 2023-11-16 LAB — URINALYSIS, ROUTINE W REFLEX MICROSCOPIC
Bilirubin Urine: NEGATIVE
Glucose, UA: NEGATIVE mg/dL
Hgb urine dipstick: NEGATIVE
Ketones, ur: NEGATIVE mg/dL
Leukocytes,Ua: NEGATIVE
Nitrite: NEGATIVE
Protein, ur: NEGATIVE mg/dL
Specific Gravity, Urine: 1.017 (ref 1.005–1.030)
pH: 6 (ref 5.0–8.0)

## 2023-11-16 LAB — COMPREHENSIVE METABOLIC PANEL
ALT: 21 U/L (ref 0–44)
AST: 20 U/L (ref 15–41)
Albumin: 3.7 g/dL (ref 3.5–5.0)
Alkaline Phosphatase: 58 U/L (ref 38–126)
Anion gap: 8 (ref 5–15)
BUN: 12 mg/dL (ref 6–20)
CO2: 25 mmol/L (ref 22–32)
Calcium: 8.7 mg/dL — ABNORMAL LOW (ref 8.9–10.3)
Chloride: 104 mmol/L (ref 98–111)
Creatinine, Ser: 0.76 mg/dL (ref 0.61–1.24)
GFR, Estimated: >60 mL/min (ref >60)
Glucose, Bld: 95 mg/dL (ref 70–99)
Potassium: 3.9 mmol/L (ref 3.5–5.1)
Sodium: 137 mmol/L (ref 135–145)
Total Bilirubin: 0.3 mg/dL (ref <1.2)
Total Protein: 6.3 g/dL — ABNORMAL LOW (ref 6.5–8.1)

## 2023-11-16 LAB — CBC WITH DIFFERENTIAL/PLATELET
Abs Immature Granulocytes: 0.02 10*3/uL (ref 0.00–0.07)
Basophils Absolute: 0 10*3/uL (ref 0.0–0.1)
Basophils Relative: 0 %
Eosinophils Absolute: 0 10*3/uL (ref 0.0–0.5)
Eosinophils Relative: 0 %
HCT: 42.6 % (ref 39.0–52.0)
Hemoglobin: 14.5 g/dL (ref 13.0–17.0)
Immature Granulocytes: 0 %
Lymphocytes Relative: 19 %
Lymphs Abs: 1.4 10*3/uL (ref 0.7–4.0)
MCH: 33.1 pg (ref 26.0–34.0)
MCHC: 34 g/dL (ref 30.0–36.0)
MCV: 97.3 fL (ref 80.0–100.0)
Monocytes Absolute: 0.5 10*3/uL (ref 0.1–1.0)
Monocytes Relative: 7 %
Neutro Abs: 5.2 10*3/uL (ref 1.7–7.7)
Neutrophils Relative %: 74 %
Platelets: 171 10*3/uL (ref 150–400)
RBC: 4.38 MIL/uL (ref 4.22–5.81)
RDW: 13 % (ref 11.5–15.5)
WBC: 7.1 10*3/uL (ref 4.0–10.5)
nRBC: 0 % (ref 0.0–0.2)

## 2023-11-16 LAB — RAPID URINE DRUG SCREEN, HOSP PERFORMED
Amphetamines: POSITIVE — AB
Barbiturates: NOT DETECTED
Benzodiazepines: POSITIVE — AB
Cocaine: POSITIVE — AB
Opiates: NOT DETECTED
Tetrahydrocannabinol: NOT DETECTED

## 2023-11-16 LAB — MAGNESIUM: Magnesium: 1.8 mg/dL (ref 1.7–2.4)

## 2023-11-16 LAB — ETHANOL: Alcohol, Ethyl (B): <10 mg/dL (ref <10)

## 2023-11-16 MED ORDER — SODIUM CHLORIDE 0.9 % IV BOLUS
1000.0000 mL | Freq: Once | INTRAVENOUS | Status: AC
  Administered 2023-11-16: 1000 mL via INTRAVENOUS

## 2023-11-16 MED ORDER — OXCARBAZEPINE 300 MG PO TABS
300.0000 mg | ORAL_TABLET | Freq: Two times a day (BID) | ORAL | Status: DC
  Administered 2023-11-16: 300 mg via ORAL
  Filled 2023-11-16: qty 1

## 2023-11-16 MED ORDER — LACTATED RINGERS IV BOLUS: 1000.0000 mL | Freq: Once | INTRAVENOUS | Status: DC

## 2023-11-16 MED ORDER — LORAZEPAM 2 MG/ML IJ SOLN
1.0000 mg | Freq: Once | INTRAMUSCULAR | Status: AC
  Administered 2023-11-16: 1 mg via INTRAVENOUS
  Filled 2023-11-16: qty 1

## 2023-11-16 NOTE — ED Provider Notes (Signed)
[place] EMERGENCY DEPARTMENT AT [facility] Provider Note   CSN: [id number] Arrival date & time: 11/16/23  1647     History  Chief Complaint  Patient presents with   Seizures    Shane Butler is a 42 y.o. male.   Seizures Patient presents for seizure-like episode.  Medical history includes anxiety, depression, seizures, substance abuse.  He was recently seen at [facility] neurology.  He was previously on Trileptal for mood disorder.  Other prescribed occasions include Xanax, Adderall.  Prior to arrival, he had a witnessed seizure episode at work.  Patient states that he has a prodrome anytime he has a seizure where it feels like time moves slowly.  He did experience this today while at work.  Because of this, he was able to get inside.  He did not sustain a fall and does not suspect any injuries from his seizure episode.  Currently, he feels back to normal.  He does state that he used some cocaine earlier today.  He also does drink alcohol.  He had a small amount of alcohol earlier today as well.  He does continue to take his Trileptal.  He does continue to take Xanax and Adderall.  Patient states that his sleep has been fair lately.  He denies any recent infectious symptoms.     Home Medications Prior to Admission medications   Medication Sig Start Date End Date Taking? Authorizing Provider  alprazolam Prudy Feeler) 2 MG tablet Take 2 mg by mouth 2 (two) times daily as needed. 03/12/23   [provider]  amphetamine-dextroamphetamine (ADDERALL) 20 MG tablet Take 20 mg by mouth 3 (three) times daily. 03/21/20   [provider]  desvenlafaxine (PRISTIQ) 100 MG 24 hr tablet Take 100 mg by mouth daily. 02/25/23   [provider]  Oxcarbazepine (TRILEPTAL) 300 MG tablet Take 300 mg by mouth 2 (two) times daily. 02/25/23   [provider]      Allergies    Gabapentin, Ciprofloxacin, and Other    Review of Systems   Review of Systems  Neurological:   Positive for seizures.  All other systems reviewed and are negative.   Physical Exam Updated Vital Signs BP 121/80   Pulse 69   Temp 98 F (36.7 C) (Oral)   Resp 12   Ht 5\' 11"  (1.803 m)   Wt 53 kg   SpO2 100%   BMI 16.30 kg/m  Physical Exam Vitals and nursing note reviewed.  Constitutional:      General: He is not in acute distress.    Appearance: Normal appearance. He is well-developed. He is not ill-appearing, toxic-appearing or diaphoretic.  HENT:     Head: Normocephalic and atraumatic.     Right Ear: External ear normal.     Left Ear: External ear normal.     Nose: Nose normal.     Mouth/Throat:     Mouth: Mucous membranes are moist.  Eyes:     Extraocular Movements: Extraocular movements intact.     Conjunctiva/sclera: Conjunctivae normal.  Cardiovascular:     Rate and Rhythm: Normal rate and regular rhythm.  Pulmonary:     Effort: Pulmonary effort is normal. No respiratory distress.  Abdominal:     General: There is no distension.     Palpations: Abdomen is soft.     Tenderness: There is no abdominal tenderness.  Musculoskeletal:        General: No swelling.     Cervical back: Normal range of  motion and neck supple.  Skin:    General: Skin is warm and dry.  Neurological:     General: No focal deficit present.     Mental Status: He is alert and oriented to person, place, and time.     Cranial Nerves: No cranial nerve deficit.     Sensory: No sensory deficit.     Motor: No weakness.     Coordination: Coordination normal.  Psychiatric:        Mood and Affect: Mood normal.        Behavior: Behavior normal.     ED Results / Procedures / Treatments   Labs (all labs ordered are listed, but only abnormal results are displayed) Labs Reviewed  COMPREHENSIVE METABOLIC PANEL - Abnormal; Notable for the following components:      Result Value   Calcium 8.7 (*)    Total Protein 6.3 (*)    All other components within normal limits  CBC WITH  DIFFERENTIAL/PLATELET  MAGNESIUM  ETHANOL  RAPID URINE DRUG SCREEN, HOSP PERFORMED  URINALYSIS, ROUTINE W REFLEX MICROSCOPIC  CBG MONITORING, ED    EKG EKG Interpretation Date/Time:  Monday November 16 2023 18:08:21 EST Ventricular Rate:  67 PR Interval:  157 QRS Duration:  89 QT Interval:  420 QTC Calculation: 444 R Axis:   81  Text Interpretation: Sinus rhythm RSR' in V1 or V2, probably normal variant Confirmed by Gloris Manchester [phone]) on 11/16/2023 7:15:51 PM  Radiology No results found.  Procedures Procedures    Medications Ordered in ED Medications  Oxcarbazepine (TRILEPTAL) tablet 300 mg (300 mg Oral Given 11/16/23 1752)  LORazepam (ATIVAN) injection 1 mg (1 mg Intravenous Given 11/16/23 1752)  sodium chloride 0.9 % bolus 1,000 mL (0 mLs Intravenous Stopped 11/16/23 1906)    ED Course/ Medical Decision Making/ A&P                                 Medical Decision Making Amount and/or Complexity of Data Reviewed Labs: ordered.  Risk Prescription drug management.   This patient presents to the ED for concern of seizure, this involves an extensive number of treatment options, and is a complaint that carries with it a high risk of complications and morbidity.  The differential diagnosis includes medication adherence, review seizure, use of sympathomimetic drugs medication, withdrawal from alcohol and/or benzodiazepines   Co morbidities that complicate the patient evaluation  anxiety, depression, seizures, substance abuse   Additional history obtained:  Additional history obtained from N/A External records from outside source obtained and reviewed including EMR   Lab Tests:  I Ordered, and personally interpreted labs.  The pertinent results include: Normal hemoglobin, no leukocytosis, normal kidney function, normal electrolytes  Cardiac Monitoring: / EKG:  The patient was maintained on a cardiac monitor.  I personally viewed and interpreted the cardiac  monitored which showed an underlying rhythm of: Sinus rhythm   Problem List / ED Course / Critical interventions / Medication management  Patient presenting for when a seizure-like episode that occurred while at work.  He does have a history of the same.  He is followed by [facility] neurology.  He has been adherent to his home Trileptal.  The following are likely inciting events for his breakthrough seizure today: Use of cocaine and Adderall; possible withdrawal component from use of Xanax and alcohol.  He was given 1 mg of Ativan in the ED for further seizure prevention.  Home dose of Trileptal was ordered.  Patient was given IV fluids.  Will check lab work and observe.  Given his return to mental baseline, discharge is anticipated.  Lab work was unremarkable.  Patient remained asymptomatic while in the ED.  He was discharged in stable condition. I ordered medication including Ativan and Trileptal for seizure prophylaxis; IV fluids for hydration Reevaluation of the patient after these medicines showed that the patient improved I have reviewed the patients home medicines and have made adjustments as needed   Social Determinants of Health:  Ongoing polysubstance abuse, has access to outpatient care         Final Clinical Impression(s) / ED Diagnoses Final diagnoses:  Seizure [facility])    Rx / DC Orders ED Discharge Orders     None         Gloris Manchester, MD 11/16/23 1919

## 2023-11-16 NOTE — Discharge Instructions (Signed)
Continue your home Trileptal.  Stay hydrated.  Keep in mind that Adderall and cocaine will lower your seizure threshold, i.e., make seizures more likely.  Withdrawal from alcohol and/or Xanax will also lower your seizure threshold.  Follow-up with your neurologist and PCP to discuss current medications.  Avoid use of recreational drugs.  Return to the emergency department for any new or worsening symptoms of concern.  Per [facility] statutes, patients with seizures are not allowed to drive until  they have been seizure-free for six months. Use caution when using heavy equipment or power tools. Avoid working on ladders or at heights. Take showers instead of baths. Ensure the water temperature is not too high on the home water heater. Do not go swimming alone. When caring for infants or small children, sit down when holding, feeding, or changing them to minimize risk of injury to the child in the event you have a seizure.   Also, Maintain good sleep hygiene. Avoid alcohol.

## 2023-11-16 NOTE — ED Triage Notes (Signed)
Pt admitted to drinking etoh and crack cocaine.

## 2023-11-16 NOTE — ED Notes (Signed)
Patient verbalizes understanding of discharge instructions. Opportunity for questioning and answers were provided. Armband removed by staff, pt discharged from ED. Ambulated out to lobby with father to drive home

## 2023-11-16 NOTE — ED Triage Notes (Signed)
Pt had witnessed seizure at work, he has hx of same.

## 2023-12-16 ENCOUNTER — Encounter: Payer: Self-pay | Admitting: Neurology

## 2023-12-16 ENCOUNTER — Ambulatory Visit: Payer: BC Managed Care – PPO | Admitting: Neurology

## 2023-12-16 DIAGNOSIS — R569 Unspecified convulsions: Secondary | ICD-10-CM | POA: Diagnosis not present

## 2023-12-16 NOTE — Procedures (Signed)
    History:  43 year old man with seizure   EEG classification: Awake and drowsy  Duration: 29 minutes   Technical aspects: This EEG study was done with scalp electrodes positioned according to the 10-20 International system of electrode placement. Electrical activity was reviewed with band pass filter of 1-70Hz , sensitivity of 7 uV/mm, display speed of 72mm/sec with a 60Hz  notched filter applied as appropriate. EEG data were recorded continuously and digitally stored.   Description of the recording: The background rhythms of this recording consists of a fairly well modulated medium amplitude alpha rhythm of 9 Hz that is reactive to eye opening and closure. Present in the anterior head region is a 15-20 Hz beta activity. Photic stimulation was performed, did not show any abnormalities. Hyperventilation was also performed, did not show any abnormalities. Drowsiness was manifested by background fragmentation. No abnormal epileptiform discharges seen during this recording. There was no focal slowing. There were no electrographic seizure identified.   Abnormality: None   Impression: This is a normal EEG recorded while drowsy and awake. No evidence of interictal epileptiform discharges. Normal EEGs, however, do not rule out epilepsy.    Jevon Littlepage, MD [facility]

## 2024-02-05 ENCOUNTER — Emergency Department (HOSPITAL_COMMUNITY)
Admission: EM | Admit: 2024-02-05 | Discharge: 2024-02-05 | Discharge status: Against Medical Advice | Payer: BC Managed Care – PPO | Attending: Emergency Medicine | Admitting: Emergency Medicine

## 2024-02-05 DIAGNOSIS — Z041 Encounter for examination and observation following transport accident: Secondary | ICD-10-CM | POA: Diagnosis not present

## 2024-02-05 DIAGNOSIS — F1410 Cocaine abuse, uncomplicated: Secondary | ICD-10-CM | POA: Insufficient documentation

## 2024-02-05 DIAGNOSIS — Y92410 Unspecified street and highway as the place of occurrence of the external cause: Secondary | ICD-10-CM | POA: Insufficient documentation

## 2024-02-05 DIAGNOSIS — R569 Unspecified convulsions: Secondary | ICD-10-CM | POA: Diagnosis not present

## 2024-02-05 DIAGNOSIS — Z5321 Procedure and treatment not carried out due to patient leaving prior to being seen by health care provider: Secondary | ICD-10-CM | POA: Diagnosis not present

## 2024-02-05 DIAGNOSIS — I10 Essential (primary) hypertension: Secondary | ICD-10-CM | POA: Diagnosis not present
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
## 2024-02-24 DIAGNOSIS — F1020 Alcohol dependence, uncomplicated: Secondary | ICD-10-CM | POA: Diagnosis not present

## 2024-02-24 DIAGNOSIS — F900 Attention-deficit hyperactivity disorder, predominantly inattentive type: Secondary | ICD-10-CM | POA: Diagnosis not present

## 2024-02-24 DIAGNOSIS — F410 Panic disorder [episodic paroxysmal anxiety] without agoraphobia: Secondary | ICD-10-CM | POA: Diagnosis not present

## 2024-02-24 DIAGNOSIS — F3342 Major depressive disorder, recurrent, in full remission: Secondary | ICD-10-CM | POA: Diagnosis not present

## 2024-02-25 DIAGNOSIS — F1020 Alcohol dependence, uncomplicated: Secondary | ICD-10-CM | POA: Diagnosis not present

## 2024-03-10 DIAGNOSIS — F1020 Alcohol dependence, uncomplicated: Secondary | ICD-10-CM | POA: Diagnosis not present

## 2024-03-11 DIAGNOSIS — F1020 Alcohol dependence, uncomplicated: Secondary | ICD-10-CM | POA: Diagnosis not present

## 2024-03-14 DIAGNOSIS — F1020 Alcohol dependence, uncomplicated: Secondary | ICD-10-CM | POA: Diagnosis not present

## 2024-03-16 DIAGNOSIS — F1020 Alcohol dependence, uncomplicated: Secondary | ICD-10-CM | POA: Diagnosis not present

## 2024-03-18 DIAGNOSIS — F1020 Alcohol dependence, uncomplicated: Secondary | ICD-10-CM | POA: Diagnosis not present

## 2024-03-21 DIAGNOSIS — F1020 Alcohol dependence, uncomplicated: Secondary | ICD-10-CM | POA: Diagnosis not present

## 2024-03-23 DIAGNOSIS — F1020 Alcohol dependence, uncomplicated: Secondary | ICD-10-CM | POA: Diagnosis not present

## 2024-03-25 DIAGNOSIS — F1020 Alcohol dependence, uncomplicated: Secondary | ICD-10-CM | POA: Diagnosis not present

## 2024-03-28 DIAGNOSIS — F1020 Alcohol dependence, uncomplicated: Secondary | ICD-10-CM | POA: Diagnosis not present

## 2024-03-30 DIAGNOSIS — F1020 Alcohol dependence, uncomplicated: Secondary | ICD-10-CM | POA: Diagnosis not present

## 2024-03-30 DIAGNOSIS — F410 Panic disorder [episodic paroxysmal anxiety] without agoraphobia: Secondary | ICD-10-CM | POA: Diagnosis not present

## 2024-03-30 DIAGNOSIS — F900 Attention-deficit hyperactivity disorder, predominantly inattentive type: Secondary | ICD-10-CM | POA: Diagnosis not present

## 2024-03-30 DIAGNOSIS — F3342 Major depressive disorder, recurrent, in full remission: Secondary | ICD-10-CM | POA: Diagnosis not present

## 2024-04-01 DIAGNOSIS — F1020 Alcohol dependence, uncomplicated: Secondary | ICD-10-CM | POA: Diagnosis not present

## 2024-04-04 DIAGNOSIS — F1020 Alcohol dependence, uncomplicated: Secondary | ICD-10-CM | POA: Diagnosis not present

## 2024-04-06 DIAGNOSIS — F1020 Alcohol dependence, uncomplicated: Secondary | ICD-10-CM | POA: Diagnosis not present

## 2024-04-08 DIAGNOSIS — F1020 Alcohol dependence, uncomplicated: Secondary | ICD-10-CM | POA: Diagnosis not present

## 2024-04-11 DIAGNOSIS — F1020 Alcohol dependence, uncomplicated: Secondary | ICD-10-CM | POA: Diagnosis not present

## 2024-04-12 DIAGNOSIS — F1020 Alcohol dependence, uncomplicated: Secondary | ICD-10-CM | POA: Diagnosis not present

## 2024-04-13 DIAGNOSIS — F1020 Alcohol dependence, uncomplicated: Secondary | ICD-10-CM | POA: Diagnosis not present

## 2024-04-15 DIAGNOSIS — F1020 Alcohol dependence, uncomplicated: Secondary | ICD-10-CM | POA: Diagnosis not present

## 2024-04-18 DIAGNOSIS — F1020 Alcohol dependence, uncomplicated: Secondary | ICD-10-CM | POA: Diagnosis not present

## 2024-04-20 DIAGNOSIS — F1020 Alcohol dependence, uncomplicated: Secondary | ICD-10-CM | POA: Diagnosis not present

## 2024-04-22 DIAGNOSIS — F1020 Alcohol dependence, uncomplicated: Secondary | ICD-10-CM | POA: Diagnosis not present

## 2024-04-25 DIAGNOSIS — F1020 Alcohol dependence, uncomplicated: Secondary | ICD-10-CM | POA: Diagnosis not present

## 2024-04-27 ENCOUNTER — Ambulatory Visit: Payer: Self-pay

## 2024-04-27 ENCOUNTER — Ambulatory Visit
Admission: EM | Admit: 2024-04-27 | Discharge: 2024-04-27 | Disposition: A | Discharge status: Home / Self Care | Attending: Nurse Practitioner | Admitting: Nurse Practitioner

## 2024-04-27 ENCOUNTER — Ambulatory Visit (INDEPENDENT_AMBULATORY_CARE_PROVIDER_SITE_OTHER)

## 2024-04-27 ENCOUNTER — Encounter: Payer: Self-pay | Admitting: Emergency Medicine

## 2024-04-27 ENCOUNTER — Telehealth: Payer: Self-pay | Admitting: Nurse Practitioner

## 2024-04-27 DIAGNOSIS — F1020 Alcohol dependence, uncomplicated: Secondary | ICD-10-CM | POA: Diagnosis not present

## 2024-04-27 DIAGNOSIS — R0789 Other chest pain: Secondary | ICD-10-CM | POA: Diagnosis not present

## 2024-04-27 DIAGNOSIS — S2231XA Fracture of one rib, right side, initial encounter for closed fracture: Secondary | ICD-10-CM | POA: Diagnosis not present

## 2024-04-27 DIAGNOSIS — R0781 Pleurodynia: Secondary | ICD-10-CM

## 2024-04-27 NOTE — Discharge Instructions (Addendum)
 Your x-rays are pending.  You will be contacted when the results of the x-rays are received.  You will also have access to results via MyChart. Continue over-the-counter Aleve  as needed for pain or discomfort. Recommend the use of warm compresses to the affected area to help with pain or discomfort. Recommend using a pillow to splint when you are coughing.  Also recommend that you continue to attempt deep breathing exercises to prevent the development of pneumonia. Go to the emergency department immediately if you experience sudden onset of shortness of breath, difficulty breathing, or other concerns. Follow-up with your primary care physician if symptoms do not improve over the next 7 to 10 days. Follow-up as needed.
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
## 2024-04-27 NOTE — Telephone Encounter (Signed)
 Called patient to discuss x-ray results.  Spoke with patient, verified patient with 2 identifiers.  Patient was advised that he does have a "minimally displaced right eighth rib fracture."  Patient advised to continue with current treatment recommendations, reiterated the importance of coughing and deep breathing to prevent atelectasis or pneumonia.  Patient advised to continue over-the-counter analgesics for pain or discomfort.  Again reiterated indications for ER follow-up.  Patient was in agreement with this plan of care and verbalized understanding.  All questions were answered.

## 2024-04-29 DIAGNOSIS — F1020 Alcohol dependence, uncomplicated: Secondary | ICD-10-CM | POA: Diagnosis not present

## 2024-05-02 DIAGNOSIS — F1020 Alcohol dependence, uncomplicated: Secondary | ICD-10-CM | POA: Diagnosis not present

## 2024-05-04 DIAGNOSIS — F1020 Alcohol dependence, uncomplicated: Secondary | ICD-10-CM | POA: Diagnosis not present

## 2024-05-05 DIAGNOSIS — F1020 Alcohol dependence, uncomplicated: Secondary | ICD-10-CM | POA: Diagnosis not present

## 2024-05-06 DIAGNOSIS — F1020 Alcohol dependence, uncomplicated: Secondary | ICD-10-CM | POA: Diagnosis not present

## 2024-05-09 DIAGNOSIS — F1020 Alcohol dependence, uncomplicated: Secondary | ICD-10-CM | POA: Diagnosis not present

## 2024-05-11 DIAGNOSIS — F1020 Alcohol dependence, uncomplicated: Secondary | ICD-10-CM | POA: Diagnosis not present

## 2024-05-13 DIAGNOSIS — F1020 Alcohol dependence, uncomplicated: Secondary | ICD-10-CM | POA: Diagnosis not present

## 2024-05-16 DIAGNOSIS — F1020 Alcohol dependence, uncomplicated: Secondary | ICD-10-CM | POA: Diagnosis not present

## 2024-05-18 DIAGNOSIS — F1020 Alcohol dependence, uncomplicated: Secondary | ICD-10-CM | POA: Diagnosis not present

## 2024-05-20 DIAGNOSIS — F1020 Alcohol dependence, uncomplicated: Secondary | ICD-10-CM | POA: Diagnosis not present

## 2024-05-23 DIAGNOSIS — F1020 Alcohol dependence, uncomplicated: Secondary | ICD-10-CM | POA: Diagnosis not present

## 2024-05-25 DIAGNOSIS — F1020 Alcohol dependence, uncomplicated: Secondary | ICD-10-CM | POA: Diagnosis not present

## 2024-05-27 DIAGNOSIS — F1020 Alcohol dependence, uncomplicated: Secondary | ICD-10-CM | POA: Diagnosis not present

## 2024-05-30 DIAGNOSIS — F1020 Alcohol dependence, uncomplicated: Secondary | ICD-10-CM | POA: Diagnosis not present

## 2024-06-01 DIAGNOSIS — F1020 Alcohol dependence, uncomplicated: Secondary | ICD-10-CM | POA: Diagnosis not present

## 2024-06-02 DIAGNOSIS — F1020 Alcohol dependence, uncomplicated: Secondary | ICD-10-CM | POA: Diagnosis not present

## 2024-06-03 DIAGNOSIS — F1020 Alcohol dependence, uncomplicated: Secondary | ICD-10-CM | POA: Diagnosis not present

## 2024-06-06 DIAGNOSIS — F1020 Alcohol dependence, uncomplicated: Secondary | ICD-10-CM | POA: Diagnosis not present

## 2024-06-08 DIAGNOSIS — F1020 Alcohol dependence, uncomplicated: Secondary | ICD-10-CM | POA: Diagnosis not present

## 2024-06-13 DIAGNOSIS — F1020 Alcohol dependence, uncomplicated: Secondary | ICD-10-CM | POA: Diagnosis not present

## 2024-06-15 DIAGNOSIS — F1020 Alcohol dependence, uncomplicated: Secondary | ICD-10-CM | POA: Diagnosis not present

## 2024-06-20 DIAGNOSIS — F1020 Alcohol dependence, uncomplicated: Secondary | ICD-10-CM | POA: Diagnosis not present

## 2024-06-22 DIAGNOSIS — F1020 Alcohol dependence, uncomplicated: Secondary | ICD-10-CM | POA: Diagnosis not present

## 2024-06-29 DIAGNOSIS — F1020 Alcohol dependence, uncomplicated: Secondary | ICD-10-CM | POA: Diagnosis not present

## 2024-07-06 DIAGNOSIS — F1020 Alcohol dependence, uncomplicated: Secondary | ICD-10-CM | POA: Diagnosis not present

## 2024-07-07 DIAGNOSIS — F1020 Alcohol dependence, uncomplicated: Secondary | ICD-10-CM | POA: Diagnosis not present

## 2024-07-08 DIAGNOSIS — F1020 Alcohol dependence, uncomplicated: Secondary | ICD-10-CM | POA: Diagnosis not present

## 2024-07-11 DIAGNOSIS — F1020 Alcohol dependence, uncomplicated: Secondary | ICD-10-CM | POA: Diagnosis not present

## 2024-07-20 DIAGNOSIS — F1020 Alcohol dependence, uncomplicated: Secondary | ICD-10-CM | POA: Diagnosis not present

## 2024-08-03 DIAGNOSIS — F1020 Alcohol dependence, uncomplicated: Secondary | ICD-10-CM | POA: Diagnosis not present

## 2024-08-08 DIAGNOSIS — F1020 Alcohol dependence, uncomplicated: Secondary | ICD-10-CM | POA: Diagnosis not present

## 2024-08-10 DIAGNOSIS — F1020 Alcohol dependence, uncomplicated: Secondary | ICD-10-CM | POA: Diagnosis not present

## 2024-08-17 DIAGNOSIS — F1020 Alcohol dependence, uncomplicated: Secondary | ICD-10-CM | POA: Diagnosis not present

## 2024-08-18 DIAGNOSIS — F1020 Alcohol dependence, uncomplicated: Secondary | ICD-10-CM | POA: Diagnosis not present

## 2024-08-24 DIAGNOSIS — F1020 Alcohol dependence, uncomplicated: Secondary | ICD-10-CM | POA: Diagnosis not present

## 2024-08-25 DIAGNOSIS — F1020 Alcohol dependence, uncomplicated: Secondary | ICD-10-CM | POA: Diagnosis not present

## 2024-09-01 DIAGNOSIS — F1020 Alcohol dependence, uncomplicated: Secondary | ICD-10-CM | POA: Diagnosis not present

## 2024-09-05 DIAGNOSIS — F1020 Alcohol dependence, uncomplicated: Secondary | ICD-10-CM | POA: Diagnosis not present

## 2024-09-07 DIAGNOSIS — F1020 Alcohol dependence, uncomplicated: Secondary | ICD-10-CM | POA: Diagnosis not present

## 2024-09-09 DIAGNOSIS — F1020 Alcohol dependence, uncomplicated: Secondary | ICD-10-CM | POA: Diagnosis not present

## 2024-09-12 DIAGNOSIS — F1020 Alcohol dependence, uncomplicated: Secondary | ICD-10-CM | POA: Diagnosis not present

## 2024-09-14 DIAGNOSIS — F1020 Alcohol dependence, uncomplicated: Secondary | ICD-10-CM | POA: Diagnosis not present

## 2024-09-16 DIAGNOSIS — F1020 Alcohol dependence, uncomplicated: Secondary | ICD-10-CM | POA: Diagnosis not present

## 2024-09-19 DIAGNOSIS — F1020 Alcohol dependence, uncomplicated: Secondary | ICD-10-CM | POA: Diagnosis not present

## 2024-09-21 DIAGNOSIS — F1020 Alcohol dependence, uncomplicated: Secondary | ICD-10-CM | POA: Diagnosis not present

## 2024-09-22 DIAGNOSIS — F3342 Major depressive disorder, recurrent, in full remission: Secondary | ICD-10-CM | POA: Diagnosis not present

## 2024-09-22 DIAGNOSIS — F410 Panic disorder [episodic paroxysmal anxiety] without agoraphobia: Secondary | ICD-10-CM | POA: Diagnosis not present

## 2024-09-22 DIAGNOSIS — F900 Attention-deficit hyperactivity disorder, predominantly inattentive type: Secondary | ICD-10-CM | POA: Diagnosis not present

## 2024-09-22 DIAGNOSIS — F1020 Alcohol dependence, uncomplicated: Secondary | ICD-10-CM | POA: Diagnosis not present

## 2024-10-05 DIAGNOSIS — F1020 Alcohol dependence, uncomplicated: Secondary | ICD-10-CM | POA: Diagnosis not present

## 2024-10-06 DIAGNOSIS — F1020 Alcohol dependence, uncomplicated: Secondary | ICD-10-CM | POA: Diagnosis not present

## 2024-10-12 DIAGNOSIS — F1020 Alcohol dependence, uncomplicated: Secondary | ICD-10-CM | POA: Diagnosis not present

## 2024-10-13 DIAGNOSIS — F1020 Alcohol dependence, uncomplicated: Secondary | ICD-10-CM | POA: Diagnosis not present

## 2024-10-17 DIAGNOSIS — F1020 Alcohol dependence, uncomplicated: Secondary | ICD-10-CM | POA: Diagnosis not present

## 2024-10-19 DIAGNOSIS — F1020 Alcohol dependence, uncomplicated: Secondary | ICD-10-CM | POA: Diagnosis not present

## 2024-10-20 DIAGNOSIS — F1020 Alcohol dependence, uncomplicated: Secondary | ICD-10-CM | POA: Diagnosis not present

## 2024-11-09 DIAGNOSIS — F1020 Alcohol dependence, uncomplicated: Secondary | ICD-10-CM | POA: Diagnosis not present

## 2024-11-10 ENCOUNTER — Encounter: Payer: Self-pay | Admitting: Neurology

## 2024-11-10 ENCOUNTER — Ambulatory Visit: Payer: BC Managed Care – PPO | Admitting: Neurology

## 2024-11-10 VITALS — Ht 71.0 in | Wt 122.5 lb

## 2024-11-10 DIAGNOSIS — R569 Unspecified convulsions: Secondary | ICD-10-CM | POA: Diagnosis not present
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
## 2024-11-10 NOTE — Patient Instructions (Signed)
Continue to follow up with PCP  Return as needed
